# Patient Record
Sex: Female | Born: 1950 | Race: White | Hispanic: No | Marital: Married
Health system: Midwestern US, Academic
[De-identification: ages and names within clinical notes are randomized; demographics above are authoritative.]

---

## 2018-09-28 ENCOUNTER — Encounter: Admit: 2018-09-28 | Discharge: 2018-09-28 | Payer: MEDICARE

## 2018-10-04 ENCOUNTER — Encounter: Admit: 2018-10-04 | Discharge: 2018-10-04 | Payer: MEDICARE

## 2018-10-19 LAB — COMPREHENSIVE METABOLIC PANEL
Lab: 0.9
Lab: 114
Lab: 142
Lab: 4.6
Lab: 49
Lab: 9.8

## 2018-10-19 LAB — THYROID STIMULATING HORMONE-TSH: Lab: 5.2 — ABNORMAL HIGH (ref 0.35–4.94)

## 2018-10-19 LAB — FREE T4 (FREE THYROXINE) ONLY: Lab: 0.7

## 2018-10-19 LAB — HEMOGLOBIN A1C: Lab: 5.6

## 2018-10-28 ENCOUNTER — Encounter: Admit: 2018-10-28 | Discharge: 2018-10-28 | Payer: MEDICARE

## 2018-10-28 DIAGNOSIS — R0602 Shortness of breath: Principal | ICD-10-CM

## 2018-10-28 DIAGNOSIS — R5382 Chronic fatigue, unspecified: ICD-10-CM

## 2018-10-28 LAB — COMPREHENSIVE METABOLIC PANEL
Lab: 0.8
Lab: 104
Lab: 14
Lab: 141
Lab: 2 — ABNORMAL HIGH (ref 0.0–1.0)
Lab: 25
Lab: 4.2
Lab: 56 — ABNORMAL HIGH (ref 5–34)
Lab: 7.6
Lab: 9.9
Lab: 98
Lab: 99

## 2018-10-28 LAB — CBC
Lab: 15
Lab: 218
Lab: 30
Lab: 33
Lab: 46
Lab: 5.1
Lab: 8
Lab: 91

## 2018-10-28 LAB — TROPONIN-I: Lab: 0

## 2018-11-16 ENCOUNTER — Encounter: Admit: 2018-11-16 | Discharge: 2018-11-16 | Payer: MEDICARE

## 2018-11-22 ENCOUNTER — Encounter: Admit: 2018-11-22 | Discharge: 2018-11-22 | Payer: MEDICARE

## 2018-11-22 DIAGNOSIS — E039 Hypothyroidism, unspecified: Secondary | ICD-10-CM

## 2018-11-22 DIAGNOSIS — G4733 Obstructive sleep apnea (adult) (pediatric): Secondary | ICD-10-CM

## 2018-11-22 DIAGNOSIS — R0600 Dyspnea, unspecified: Secondary | ICD-10-CM

## 2018-11-22 DIAGNOSIS — R079 Chest pain, unspecified: Secondary | ICD-10-CM

## 2018-11-25 ENCOUNTER — Encounter: Admit: 2018-11-25 | Discharge: 2018-11-25 | Payer: MEDICARE

## 2018-11-25 ENCOUNTER — Ambulatory Visit: Admit: 2018-11-25 | Discharge: 2018-11-25 | Payer: MEDICARE

## 2018-11-25 DIAGNOSIS — R0600 Dyspnea, unspecified: Secondary | ICD-10-CM

## 2018-11-25 DIAGNOSIS — R079 Chest pain, unspecified: Secondary | ICD-10-CM

## 2018-11-25 DIAGNOSIS — I2510 Atherosclerotic heart disease of native coronary artery without angina pectoris: ICD-10-CM

## 2018-11-25 DIAGNOSIS — G4733 Obstructive sleep apnea (adult) (pediatric): Secondary | ICD-10-CM

## 2018-11-25 DIAGNOSIS — E039 Hypothyroidism, unspecified: Secondary | ICD-10-CM

## 2018-11-25 LAB — BASIC METABOLIC PANEL
Lab: 141
Lab: 4.2

## 2018-11-25 LAB — CBC
Lab: 13
Lab: 29 — ABNORMAL HIGH (ref 8.4–10.2)
Lab: 47 — ABNORMAL HIGH (ref 37.0–47.0)
Lab: 15
Lab: 218
Lab: 31 — ABNORMAL LOW (ref 33.0–37.0)
Lab: 5
Lab: 7.4
Lab: 94

## 2018-11-25 MED ORDER — TEMAZEPAM 7.5 MG PO CAP
15 mg | Freq: Every evening | ORAL | 0 refills | Status: CN | PRN
Start: 2018-11-25 — End: ?

## 2018-11-25 MED ORDER — ACETAMINOPHEN 325 MG PO TAB
650 mg | ORAL | 0 refills | Status: CN | PRN
Start: 2018-11-25 — End: ?

## 2018-11-25 MED ORDER — ALUMINUM-MAGNESIUM HYDROXIDE 200-200 MG/5 ML PO SUSP
30 mL | ORAL | 0 refills | Status: CN | PRN
Start: 2018-11-25 — End: ?

## 2018-11-25 MED ORDER — DOCUSATE SODIUM 100 MG PO CAP
100 mg | Freq: Every day | ORAL | 0 refills | Status: CN | PRN
Start: 2018-11-25 — End: ?

## 2018-11-25 MED ORDER — NITROGLYCERIN 0.3 MG SL SUBL
.4 mg | SUBLINGUAL | 0 refills | Status: CN | PRN
Start: 2018-11-25 — End: ?

## 2018-11-30 ENCOUNTER — Encounter: Admit: 2018-11-30 | Discharge: 2018-11-30 | Payer: MEDICARE

## 2018-12-01 ENCOUNTER — Ambulatory Visit: Admit: 2018-12-01 | Discharge: 2018-12-01 | Payer: MEDICARE

## 2018-12-01 ENCOUNTER — Encounter: Admit: 2018-12-01 | Discharge: 2018-12-01 | Payer: MEDICARE

## 2018-12-01 DIAGNOSIS — I5189 Other ill-defined heart diseases: Secondary | ICD-10-CM

## 2018-12-01 DIAGNOSIS — R9439 Abnormal result of other cardiovascular function study: Secondary | ICD-10-CM

## 2018-12-01 DIAGNOSIS — G4733 Obstructive sleep apnea (adult) (pediatric): Secondary | ICD-10-CM

## 2018-12-01 DIAGNOSIS — R0600 Dyspnea, unspecified: Secondary | ICD-10-CM

## 2018-12-01 DIAGNOSIS — R0789 Other chest pain: Secondary | ICD-10-CM

## 2018-12-01 DIAGNOSIS — R079 Chest pain, unspecified: Secondary | ICD-10-CM

## 2018-12-01 DIAGNOSIS — E039 Hypothyroidism, unspecified: Secondary | ICD-10-CM

## 2018-12-01 LAB — LIPID PROFILE
Lab: 122 mg/dL (ref <150)
Lab: 147 mg/dL (ref <200)
Lab: 24 mg/dL
Lab: 55 mg/dL (ref >40)
Lab: 86 mg/dL (ref <100)
Lab: 92 mg/dL

## 2018-12-01 LAB — POC GLUCOSE: Lab: 108 mg/dL — ABNORMAL HIGH (ref 70–100)

## 2018-12-01 MED ORDER — KETOROLAC 15 MG/ML IJ SOLN
15 mg | Freq: Once | INTRAVENOUS | 0 refills | Status: DC
Start: 2018-12-01 — End: 2018-12-02

## 2018-12-01 MED ORDER — DOCUSATE SODIUM 100 MG PO CAP
100 mg | Freq: Every day | ORAL | 0 refills | Status: DC | PRN
Start: 2018-12-01 — End: 2018-12-02

## 2018-12-01 MED ORDER — DIPHENHYDRAMINE HCL 50 MG/ML IJ SOLN
25 mg | INTRAVENOUS | 0 refills | Status: DC | PRN
Start: 2018-12-01 — End: 2018-12-02

## 2018-12-01 MED ORDER — ACETAMINOPHEN 325 MG PO TAB
650 mg | ORAL | 0 refills | Status: DC | PRN
Start: 2018-12-01 — End: 2018-12-02
  Administered 2018-12-01: 21:00:00 650 mg via ORAL

## 2018-12-01 MED ORDER — TEMAZEPAM 15 MG PO CAP
15 mg | Freq: Every evening | ORAL | 0 refills | Status: DC | PRN
Start: 2018-12-01 — End: 2018-12-02

## 2018-12-01 MED ORDER — NITROGLYCERIN 0.4 MG SL SUBL
.4 mg | SUBLINGUAL | 0 refills | Status: DC | PRN
Start: 2018-12-01 — End: 2018-12-02

## 2018-12-01 MED ORDER — ALUMINUM-MAGNESIUM HYDROXIDE 200-200 MG/5 ML PO SUSP
30 mL | ORAL | 0 refills | Status: DC | PRN
Start: 2018-12-01 — End: 2018-12-02

## 2018-12-01 MED ORDER — ASPIRIN 325 MG PO TAB
325 mg | Freq: Once | ORAL | 0 refills | Status: CP
Start: 2018-12-01 — End: ?
  Administered 2018-12-01: 16:00:00 325 mg via ORAL

## 2018-12-01 MED ORDER — SODIUM CHLORIDE 0.9 % IV SOLP
1000 mL | Freq: Once | INTRAVENOUS | 0 refills | Status: CP
Start: 2018-12-01 — End: ?
  Administered 2018-12-01: 16:00:00 1000 mL via INTRAVENOUS

## 2018-12-01 MED ORDER — METFORMIN 500 MG PO TAB
500 mg | ORAL_TABLET | Freq: Every day | ORAL | 0 refills | Status: AC
Start: 2018-12-01 — End: ?

## 2018-12-01 MED ORDER — ASPIRIN 81 MG PO TBEC
81 mg | ORAL_TABLET | Freq: Every day | ORAL | 0 refills | Status: AC
Start: 2018-12-01 — End: 2020-03-13

## 2018-12-01 MED ORDER — DIPHENHYDRAMINE HCL 25 MG PO CAP
25 mg | ORAL | 0 refills | Status: DC | PRN
Start: 2018-12-01 — End: 2018-12-02

## 2018-12-01 MED ORDER — ONDANSETRON HCL (PF) 4 MG/2 ML IJ SOLN
4 mg | INTRAVENOUS | 0 refills | Status: DC | PRN
Start: 2018-12-01 — End: 2018-12-02

## 2018-12-01 MED ADMIN — KETOROLAC 30 MG/ML (1 ML) IJ SOLN [22473]: 15 mg | INTRAVENOUS | @ 22:00:00 | Stop: 2018-12-01 | NDC 47781058493

## 2019-01-06 ENCOUNTER — Encounter: Admit: 2019-01-06 | Discharge: 2019-01-06 | Payer: MEDICARE

## 2019-01-06 ENCOUNTER — Ambulatory Visit: Admit: 2019-01-06 | Discharge: 2019-01-07 | Payer: MEDICARE

## 2019-01-06 DIAGNOSIS — I2510 Atherosclerotic heart disease of native coronary artery without angina pectoris: Principal | ICD-10-CM

## 2019-01-06 DIAGNOSIS — R0600 Dyspnea, unspecified: ICD-10-CM

## 2019-01-06 DIAGNOSIS — G4733 Obstructive sleep apnea (adult) (pediatric): ICD-10-CM

## 2019-01-06 DIAGNOSIS — R079 Chest pain, unspecified: Principal | ICD-10-CM

## 2019-01-06 DIAGNOSIS — E782 Mixed hyperlipidemia: ICD-10-CM

## 2019-01-06 DIAGNOSIS — I5189 Other ill-defined heart diseases: ICD-10-CM

## 2019-01-06 DIAGNOSIS — E039 Hypothyroidism, unspecified: ICD-10-CM

## 2019-06-23 ENCOUNTER — Encounter: Admit: 2019-06-23 | Discharge: 2019-06-23

## 2019-06-23 ENCOUNTER — Ambulatory Visit: Admit: 2019-06-23 | Discharge: 2019-06-24

## 2019-06-23 DIAGNOSIS — I10 Essential (primary) hypertension: Secondary | ICD-10-CM

## 2019-06-23 DIAGNOSIS — G4733 Obstructive sleep apnea (adult) (pediatric): Secondary | ICD-10-CM

## 2019-06-23 DIAGNOSIS — R079 Chest pain, unspecified: Secondary | ICD-10-CM

## 2019-06-23 DIAGNOSIS — R0600 Dyspnea, unspecified: Secondary | ICD-10-CM

## 2019-06-23 DIAGNOSIS — E039 Hypothyroidism, unspecified: Secondary | ICD-10-CM

## 2019-06-23 DIAGNOSIS — E038 Other specified hypothyroidism: Secondary | ICD-10-CM

## 2019-06-23 LAB — SED RATE: Lab: 22

## 2019-06-23 LAB — BNP (B-TYPE NATRIURETIC PEPTI): Lab: 15

## 2019-06-23 LAB — THYROID STIMULATING HORMONE-TSH: Lab: 1.3

## 2019-06-23 NOTE — Progress Notes
Date of Service: 06/23/2019    Nichole Wright is a 68 y.o. female.       HPI     I saw Nichole Wright today in followup of her hyperlipidemia, chest pain.  The chest pain has essentially resolved.  Her primary complaint now is that she cannot seem to lose weight.  She tells me she is very active throughout the day, does not seem to have increased her food intake, but yet is not losing weight.       She does need to lose about 25 to 30 pounds at a minimum.  She is wearing a boot over her right lower ankle due to a recent ligament strain, so we cannot really do much extra walking until that is completely healed.       Otherwise, I think she needs to have some sort of increased daily activity for 20 minutes daily without fail in addition to her current activities. I would do it as an add-on, either in the evenings or in the morning as a 20 minute walk once her leg is healed.    ([id number])             Vitals:    06/23/19 1346   BP: 112/62   BP Source: Arm, Left Upper   Pulse: 68   Weight: 99.8 kg (220 lb)   Height: 1.626 m (5' 4)   PainSc: Zero     Body mass index is 37.76 kg/m???.     Past Medical History  Patient Active Problem List    Diagnosis Date Noted   ??? Diastolic dysfunction 12/01/2018   ??? Hyperlipidemia 12/01/2018   ??? Coronary artery disease due to lipid rich plaque 12/01/2018   ??? Chest pain 11/22/2018   ??? Dyspnea on exertion 11/22/2018     10/29/2018 PFT's: Essentially normal baseline spirometry.  There was a trend towards improvement post bronchodilator.  Normal lung volumes.  Total lung capacity 86% of predicted.  The ERV, however, was only 90% which goes along with an obesity pattern.  Normal diffusion at 82%.  Flow volume loops and time volume curves look relatively normal.   10/29/2018 CT Chest:  No acute abnormalities were identified in the CT scan of the chest.  06/09/2018  Echo:  Est EF 60%.  Mild concentric left ventricular hypertrophy.  Diastolic assessment reveals Grade I diastolic dysfunction. Normal right ventricle structure and function.  Trivial to small global pericardial effusion.     ??? Hypothyroidism 11/22/2018   ??? OSA (obstructive sleep apnea) 11/22/2018         Review of Systems   Constitution: Positive for malaise/fatigue.   HENT: Negative.    Eyes: Negative.    Cardiovascular: Negative.    Respiratory: Negative.    Endocrine: Negative.    Hematologic/Lymphatic: Negative.    Skin: Negative.    Musculoskeletal: Negative.    Gastrointestinal: Negative.    Genitourinary: Negative.    Neurological: Negative.    Psychiatric/Behavioral: Negative.    Allergic/Immunologic: Negative.        Physical Exam  Examination reveals a 68 year old lady who is obese but in no apparent distress.  She is wearing a boot/ligament brace over her right ankle.  She has no pitting edema, cyanosis, or clubbing.  Affect is normal.  No wheezing.    ([id number])      Cardiovascular Studies  We are going to check a BNP, a sed rate, and a TSH.    ([id number])  Problems Addressed Today  No diagnosis found.    Assessment and Plan     In summary, this is a 68 year old lady who, despite being very active in her yard throughout the summer months, has been unable to lose weight.  She has not had her TSH checked in the last 6 months.  She is on a low dose of Synthroid.  I am just going to make sure that she is not hypothyroid with a blood sample.  We will also do a BNP.  She states that she gets short of breath with exercise, but has no obstructive disease, so I want to be sure that we do not have some other evidence that might support diastolic dysfunction.  For now, we will wait for the test results and then have her back.  I would like to see her back in 6 weeks.  I am giving her a target of a 6-pound weight loss over that time interval.    ([id number])             Current Medications (including today's revisions)  ??? aspirin EC 81 mg tablet Take one tablet by mouth daily. Take with food. ??? cefdinir (OMNICEF) 300 mg capsule Take 300 mg by mouth every 12 hours.   ??? clonazePAM (KLONOPIN) 1 mg tablet Take 1 mg by mouth daily.   ??? folic acid/multivit-min/lutein (CENTRUM SILVER PO) Take 1 tablet by mouth daily.   ??? levocetirizine 5 mg tab Take 1 tablet by mouth at bedtime daily.   ??? levothyroxine (SYNTHROID) 75 mcg tablet Take 75 mcg by mouth daily.   ??? metFORMIN (GLUCOPHAGE) 500 mg tablet Take one tablet by mouth daily. resume on 1/24 with evening dose   ??? montelukast (SINGULAIR) 10 mg tablet Take 10 mg by mouth at bedtime daily.   ??? phentermine (ADIPEX-P) 37.5 mg tablet Take 1 tablet by mouth daily.   ??? pregabalin (LYRICA) 150 mg capsule Take 150 mg by mouth twice daily.   ??? rosuvastatin (CRESTOR) 5 mg tablet Take 5 mg by mouth daily.   ??? traMADoL (ULTRAM) 50 mg tablet Q8H

## 2019-06-24 ENCOUNTER — Encounter: Admit: 2019-06-24 | Discharge: 2019-06-24

## 2019-06-24 DIAGNOSIS — I10 Essential (primary) hypertension: Secondary | ICD-10-CM

## 2019-06-24 DIAGNOSIS — R51 Headache: Secondary | ICD-10-CM

## 2019-06-24 DIAGNOSIS — R55 Syncope and collapse: Secondary | ICD-10-CM

## 2019-06-24 DIAGNOSIS — N200 Calculus of kidney: Secondary | ICD-10-CM

## 2019-06-24 DIAGNOSIS — E038 Other specified hypothyroidism: Secondary | ICD-10-CM

## 2019-06-24 DIAGNOSIS — G4730 Sleep apnea, unspecified: Secondary | ICD-10-CM

## 2019-06-24 DIAGNOSIS — R0683 Snoring: Secondary | ICD-10-CM

## 2019-06-24 DIAGNOSIS — G4733 Obstructive sleep apnea (adult) (pediatric): Secondary | ICD-10-CM

## 2019-06-24 DIAGNOSIS — R0600 Dyspnea, unspecified: Secondary | ICD-10-CM

## 2019-06-24 DIAGNOSIS — M1990 Unspecified osteoarthritis, unspecified site: Secondary | ICD-10-CM

## 2019-06-24 DIAGNOSIS — E039 Hypothyroidism, unspecified: Secondary | ICD-10-CM

## 2019-06-24 DIAGNOSIS — I119 Hypertensive heart disease without heart failure: Secondary | ICD-10-CM

## 2019-06-24 DIAGNOSIS — F329 Major depressive disorder, single episode, unspecified: Secondary | ICD-10-CM

## 2019-06-24 DIAGNOSIS — R079 Chest pain, unspecified: Secondary | ICD-10-CM

## 2019-06-24 DIAGNOSIS — K660 Peritoneal adhesions (postprocedural) (postinfection): Secondary | ICD-10-CM

## 2019-06-24 DIAGNOSIS — J302 Other seasonal allergic rhinitis: Secondary | ICD-10-CM

## 2019-06-24 DIAGNOSIS — N159 Renal tubulo-interstitial disease, unspecified: Secondary | ICD-10-CM

## 2019-08-23 ENCOUNTER — Encounter: Admit: 2019-08-23 | Discharge: 2019-08-23 | Payer: MEDICARE

## 2019-08-23 DIAGNOSIS — E782 Mixed hyperlipidemia: Secondary | ICD-10-CM

## 2019-08-23 DIAGNOSIS — M1990 Unspecified osteoarthritis, unspecified site: Secondary | ICD-10-CM

## 2019-08-23 DIAGNOSIS — J302 Other seasonal allergic rhinitis: Secondary | ICD-10-CM

## 2019-08-23 DIAGNOSIS — E039 Hypothyroidism, unspecified: Secondary | ICD-10-CM

## 2019-08-23 DIAGNOSIS — F329 Major depressive disorder, single episode, unspecified: Secondary | ICD-10-CM

## 2019-08-23 DIAGNOSIS — G4733 Obstructive sleep apnea (adult) (pediatric): Secondary | ICD-10-CM

## 2019-08-23 DIAGNOSIS — I119 Hypertensive heart disease without heart failure: Secondary | ICD-10-CM

## 2019-08-23 DIAGNOSIS — R0600 Dyspnea, unspecified: Secondary | ICD-10-CM

## 2019-08-23 DIAGNOSIS — K660 Peritoneal adhesions (postprocedural) (postinfection): Secondary | ICD-10-CM

## 2019-08-23 DIAGNOSIS — N159 Renal tubulo-interstitial disease, unspecified: Secondary | ICD-10-CM

## 2019-08-23 DIAGNOSIS — G4730 Sleep apnea, unspecified: Secondary | ICD-10-CM

## 2019-08-23 DIAGNOSIS — R519 Chronic headache: Secondary | ICD-10-CM

## 2019-08-23 DIAGNOSIS — R079 Chest pain, unspecified: Secondary | ICD-10-CM

## 2019-08-23 DIAGNOSIS — R55 Syncope and collapse: Secondary | ICD-10-CM

## 2019-08-23 DIAGNOSIS — N200 Calculus of kidney: Secondary | ICD-10-CM

## 2019-08-23 DIAGNOSIS — R0683 Snoring: Secondary | ICD-10-CM

## 2019-09-13 ENCOUNTER — Encounter: Admit: 2019-09-13 | Discharge: 2019-09-13 | Payer: MEDICARE

## 2019-09-13 NOTE — Progress Notes
Received a request from Dr. Maple Hudson office asking for cardiac clearance for R ankle tendon repair scheduled 09/29/2019.  Will review with TLR and then respond to Dr. Maple Hudson request.        Last OV 08/2019  Last Thallium 10/2018: SUMMARY/OPINION:This study is normal with no evidence of significant myocardial ischemia. Left ventricular systolic function is normal. There are no high risk prognostic indicators present.  The pharmacologic ECG portion of the study is negative for ischemia.

## 2019-09-20 ENCOUNTER — Encounter: Admit: 2019-09-20 | Discharge: 2019-09-20 | Payer: MEDICARE

## 2019-09-20 NOTE — Progress Notes
Patient is here for ekg needs cardiac clearance.  She denies chest pain, shortness of breath or any other cardiology issues.

## 2020-03-13 ENCOUNTER — Encounter: Admit: 2020-03-13 | Discharge: 2020-03-13 | Payer: MEDICARE

## 2020-03-13 LAB — D-DIMER: Lab: 248

## 2020-04-10 ENCOUNTER — Encounter: Admit: 2020-04-10 | Discharge: 2020-04-10 | Payer: MEDICARE

## 2020-04-10 DIAGNOSIS — R0602 Shortness of breath: Secondary | ICD-10-CM

## 2020-04-10 DIAGNOSIS — I119 Hypertensive heart disease without heart failure: Secondary | ICD-10-CM

## 2020-04-10 DIAGNOSIS — N159 Renal tubulo-interstitial disease, unspecified: Secondary | ICD-10-CM

## 2020-04-10 DIAGNOSIS — I5189 Other ill-defined heart diseases: Secondary | ICD-10-CM

## 2020-04-10 DIAGNOSIS — G4730 Sleep apnea, unspecified: Secondary | ICD-10-CM

## 2020-04-10 DIAGNOSIS — R0600 Dyspnea, unspecified: Secondary | ICD-10-CM

## 2020-04-10 DIAGNOSIS — R55 Syncope and collapse: Secondary | ICD-10-CM

## 2020-04-10 DIAGNOSIS — R079 Chest pain, unspecified: Secondary | ICD-10-CM

## 2020-04-10 DIAGNOSIS — R519 Chronic headache: Secondary | ICD-10-CM

## 2020-04-10 DIAGNOSIS — R0683 Snoring: Secondary | ICD-10-CM

## 2020-04-10 DIAGNOSIS — J302 Other seasonal allergic rhinitis: Secondary | ICD-10-CM

## 2020-04-10 DIAGNOSIS — N200 Calculus of kidney: Secondary | ICD-10-CM

## 2020-04-10 DIAGNOSIS — F329 Major depressive disorder, single episode, unspecified: Secondary | ICD-10-CM

## 2020-04-10 DIAGNOSIS — G4733 Obstructive sleep apnea (adult) (pediatric): Secondary | ICD-10-CM

## 2020-04-10 DIAGNOSIS — M1990 Unspecified osteoarthritis, unspecified site: Secondary | ICD-10-CM

## 2020-04-10 DIAGNOSIS — I10 Essential (primary) hypertension: Secondary | ICD-10-CM

## 2020-04-10 DIAGNOSIS — E039 Hypothyroidism, unspecified: Secondary | ICD-10-CM

## 2020-04-10 DIAGNOSIS — K660 Peritoneal adhesions (postprocedural) (postinfection): Secondary | ICD-10-CM

## 2020-04-13 ENCOUNTER — Encounter: Admit: 2020-04-13 | Discharge: 2020-04-13 | Payer: MEDICARE

## 2020-04-13 ENCOUNTER — Ambulatory Visit: Admit: 2020-04-13 | Discharge: 2020-04-13 | Payer: MEDICARE

## 2020-04-13 DIAGNOSIS — R0600 Dyspnea, unspecified: Secondary | ICD-10-CM

## 2020-04-27 ENCOUNTER — Encounter: Admit: 2020-04-27 | Discharge: 2020-04-27 | Payer: MEDICARE

## 2020-04-27 DIAGNOSIS — R0602 Shortness of breath: Secondary | ICD-10-CM

## 2020-04-27 DIAGNOSIS — I10 Essential (primary) hypertension: Secondary | ICD-10-CM

## 2020-04-27 DIAGNOSIS — R0600 Dyspnea, unspecified: Secondary | ICD-10-CM

## 2020-05-01 ENCOUNTER — Encounter: Admit: 2020-05-01 | Discharge: 2020-05-01 | Payer: MEDICARE

## 2020-05-31 ENCOUNTER — Encounter: Admit: 2020-05-31 | Discharge: 2020-05-31 | Payer: MEDICARE

## 2020-05-31 DIAGNOSIS — K660 Peritoneal adhesions (postprocedural) (postinfection): Secondary | ICD-10-CM

## 2020-05-31 DIAGNOSIS — G4730 Sleep apnea, unspecified: Secondary | ICD-10-CM

## 2020-05-31 DIAGNOSIS — F329 Major depressive disorder, single episode, unspecified: Secondary | ICD-10-CM

## 2020-05-31 DIAGNOSIS — N200 Calculus of kidney: Secondary | ICD-10-CM

## 2020-05-31 DIAGNOSIS — E039 Hypothyroidism, unspecified: Secondary | ICD-10-CM

## 2020-05-31 DIAGNOSIS — R079 Chest pain, unspecified: Secondary | ICD-10-CM

## 2020-05-31 DIAGNOSIS — M1990 Unspecified osteoarthritis, unspecified site: Secondary | ICD-10-CM

## 2020-05-31 DIAGNOSIS — R0600 Dyspnea, unspecified: Secondary | ICD-10-CM

## 2020-05-31 DIAGNOSIS — M79605 Pain in left leg: Secondary | ICD-10-CM

## 2020-05-31 DIAGNOSIS — I10 Essential (primary) hypertension: Secondary | ICD-10-CM

## 2020-05-31 DIAGNOSIS — I119 Hypertensive heart disease without heart failure: Secondary | ICD-10-CM

## 2020-05-31 DIAGNOSIS — R0989 Other specified symptoms and signs involving the circulatory and respiratory systems: Secondary | ICD-10-CM

## 2020-05-31 DIAGNOSIS — R0683 Snoring: Secondary | ICD-10-CM

## 2020-05-31 DIAGNOSIS — I825Z2 Chronic embolism and thrombosis of unspecified deep veins of left distal lower extremity: Secondary | ICD-10-CM

## 2020-05-31 DIAGNOSIS — J302 Other seasonal allergic rhinitis: Secondary | ICD-10-CM

## 2020-05-31 DIAGNOSIS — R55 Syncope and collapse: Secondary | ICD-10-CM

## 2020-05-31 DIAGNOSIS — G4733 Obstructive sleep apnea (adult) (pediatric): Secondary | ICD-10-CM

## 2020-05-31 DIAGNOSIS — R0602 Shortness of breath: Secondary | ICD-10-CM

## 2020-05-31 DIAGNOSIS — N159 Renal tubulo-interstitial disease, unspecified: Secondary | ICD-10-CM

## 2020-05-31 DIAGNOSIS — R519 Chronic headache: Secondary | ICD-10-CM

## 2020-05-31 NOTE — Telephone Encounter
Dr. Geronimo Boot reviewed [place] report in clinic today. Called patient to discuss results. Patient has no questions at this time.

## 2020-05-31 NOTE — Patient Instructions
Ultrasound left leg    Exercise EKG

## 2020-06-13 ENCOUNTER — Encounter: Admit: 2020-06-13 | Discharge: 2020-06-13 | Payer: MEDICARE

## 2020-06-13 ENCOUNTER — Ambulatory Visit: Admit: 2020-06-13 | Discharge: 2020-06-13 | Payer: MEDICARE

## 2020-06-13 DIAGNOSIS — R0600 Dyspnea, unspecified: Secondary | ICD-10-CM

## 2020-06-13 DIAGNOSIS — R0602 Shortness of breath: Secondary | ICD-10-CM

## 2020-06-13 DIAGNOSIS — I10 Essential (primary) hypertension: Secondary | ICD-10-CM

## 2020-06-13 MED ORDER — EUCALYPTUS-MENTHOL MM LOZG
1 | Freq: Once | ORAL | 0 refills | Status: AC | PRN
Start: 2020-06-13 — End: ?

## 2020-06-13 MED ORDER — SODIUM CHLORIDE 0.9 % IV SOLP
250 mL | INTRAVENOUS | 0 refills | Status: DC | PRN
Start: 2020-06-13 — End: 2020-06-18

## 2020-06-13 MED ORDER — NITROGLYCERIN 0.4 MG SL SUBL
.4 mg | SUBLINGUAL | 0 refills | Status: DC | PRN
Start: 2020-06-13 — End: 2020-06-18

## 2020-06-13 NOTE — Progress Notes
Patient tolerated treadmill only stress test well. Patient was disconnected from EKG monitor and changed into her clothing when she began having sharp chest pain that radiated to left arm. Patient connected back up for EKG and bp. Physician, Dr. Sandria Manly notified. Patient given one dose of nitro (BP 132/80) with no relief and 5 minutes later given a second dose (BP 132/80) with no change in pain. Dr. Sandria Manly examined patient, reviewed test/EKG and past records. Dr. Sandria Manly feels since her pain is reproduceable and worse with inspiration and palpation it is likely musculoskeletal pain. Patient allowed to rest and then home with husband. Discussed with patient when seek medical care.     Patient's blood sugar checked during this episode also and it was 97.

## 2020-06-28 ENCOUNTER — Encounter: Admit: 2020-06-28 | Discharge: 2020-06-28 | Payer: MEDICARE

## 2020-06-28 NOTE — Telephone Encounter
-----   Message from Renetta Chalk, RN sent at 06/27/2020  5:23 PM CDT -----    ----- Message -----  From: Laurence Aly, MD  Sent: 06/27/2020   3:48 PM CDT  To: Renetta Chalk, RN    Let pt know this is normal.

## 2020-06-28 NOTE — Telephone Encounter
Left voicemail notifying patient her treadmill only test was normal. Left call back number for further questions and concerns.

## 2021-02-01 ENCOUNTER — Ambulatory Visit: Admit: 2021-02-01 | Discharge: 2021-02-01 | Payer: MEDICARE

## 2021-02-01 ENCOUNTER — Encounter: Admit: 2021-02-01 | Discharge: 2021-02-01 | Payer: MEDICARE

## 2021-02-01 DIAGNOSIS — E039 Hypothyroidism, unspecified: Secondary | ICD-10-CM

## 2021-02-01 DIAGNOSIS — E119 Type 2 diabetes mellitus without complications: Secondary | ICD-10-CM

## 2021-02-01 DIAGNOSIS — E785 Hyperlipidemia, unspecified: Secondary | ICD-10-CM

## 2021-02-01 DIAGNOSIS — Z789 Other specified health status: Secondary | ICD-10-CM

## 2021-02-01 DIAGNOSIS — R0600 Dyspnea, unspecified: Secondary | ICD-10-CM

## 2021-02-01 DIAGNOSIS — J189 Pneumonia, unspecified organism: Secondary | ICD-10-CM

## 2022-09-08 ENCOUNTER — Encounter: Admit: 2022-09-08 | Discharge: 2022-09-08 | Payer: MEDICARE

## 2023-09-16 IMAGING — CR SHOULDCMRT
4 series · 4 of 4 positions shown · non-contrast
Comparison: none

[w shoulder external right]
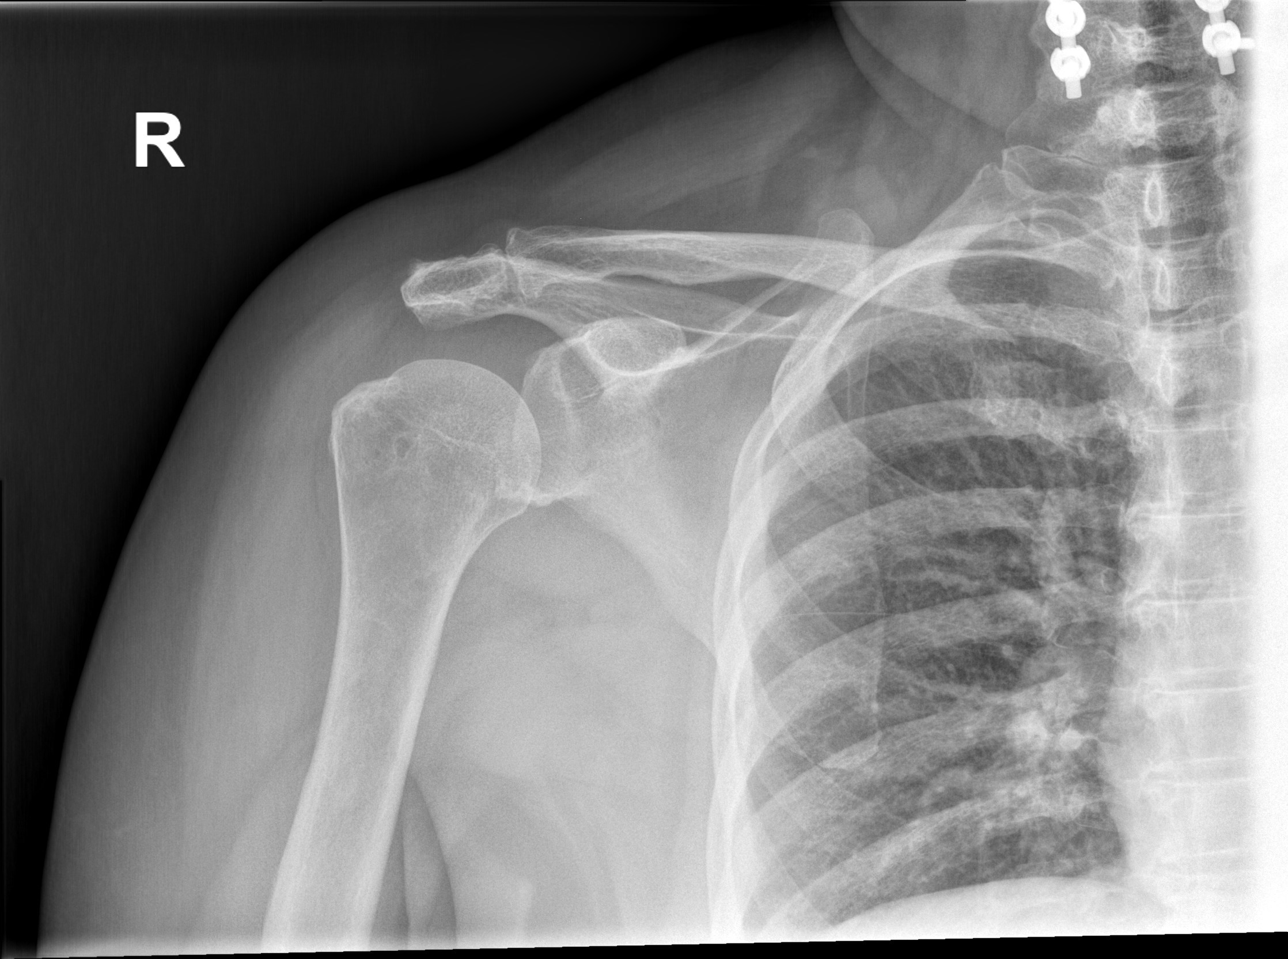

[w shoulder internal right]
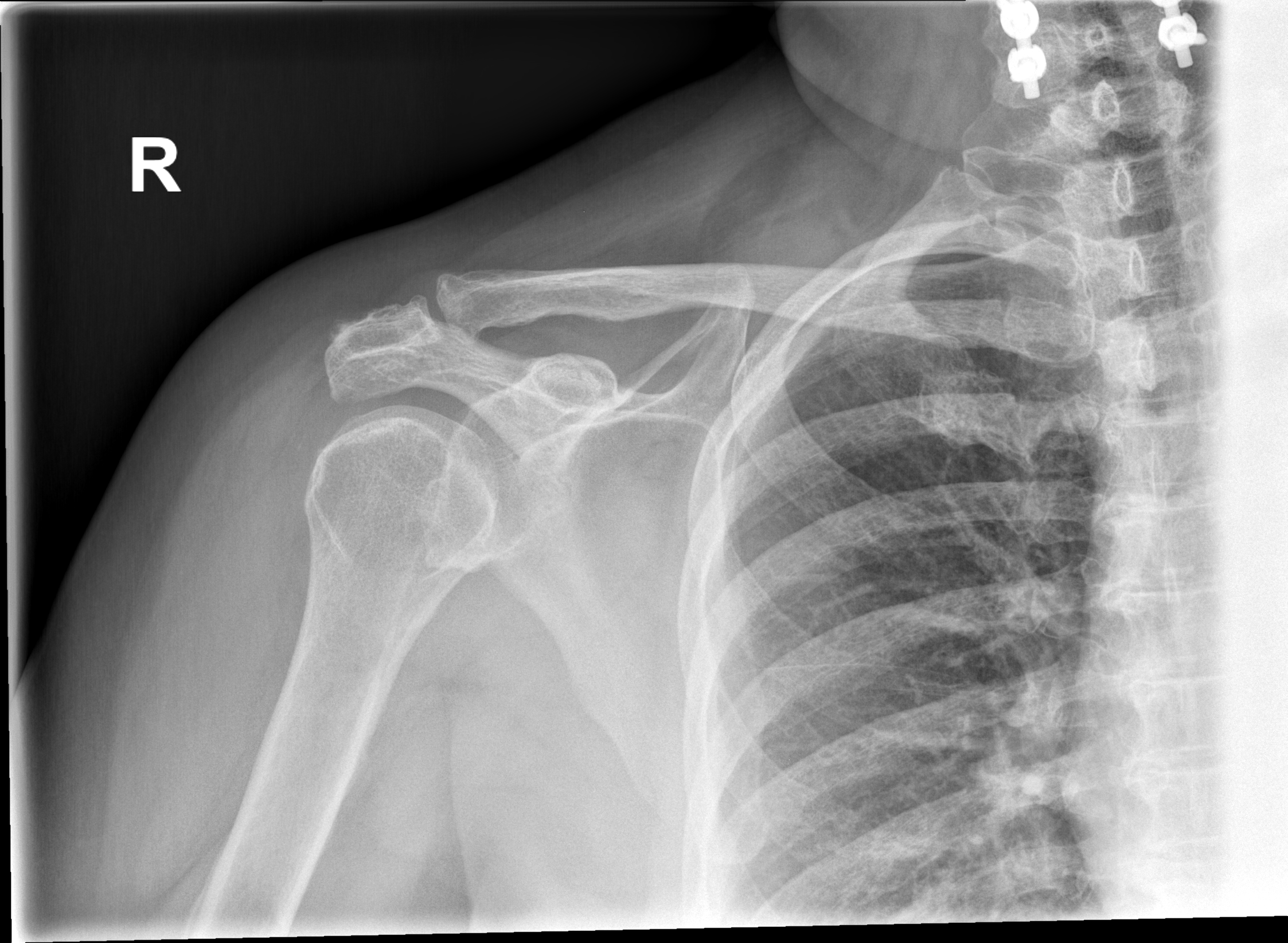

[w shoulder y-view ap right]
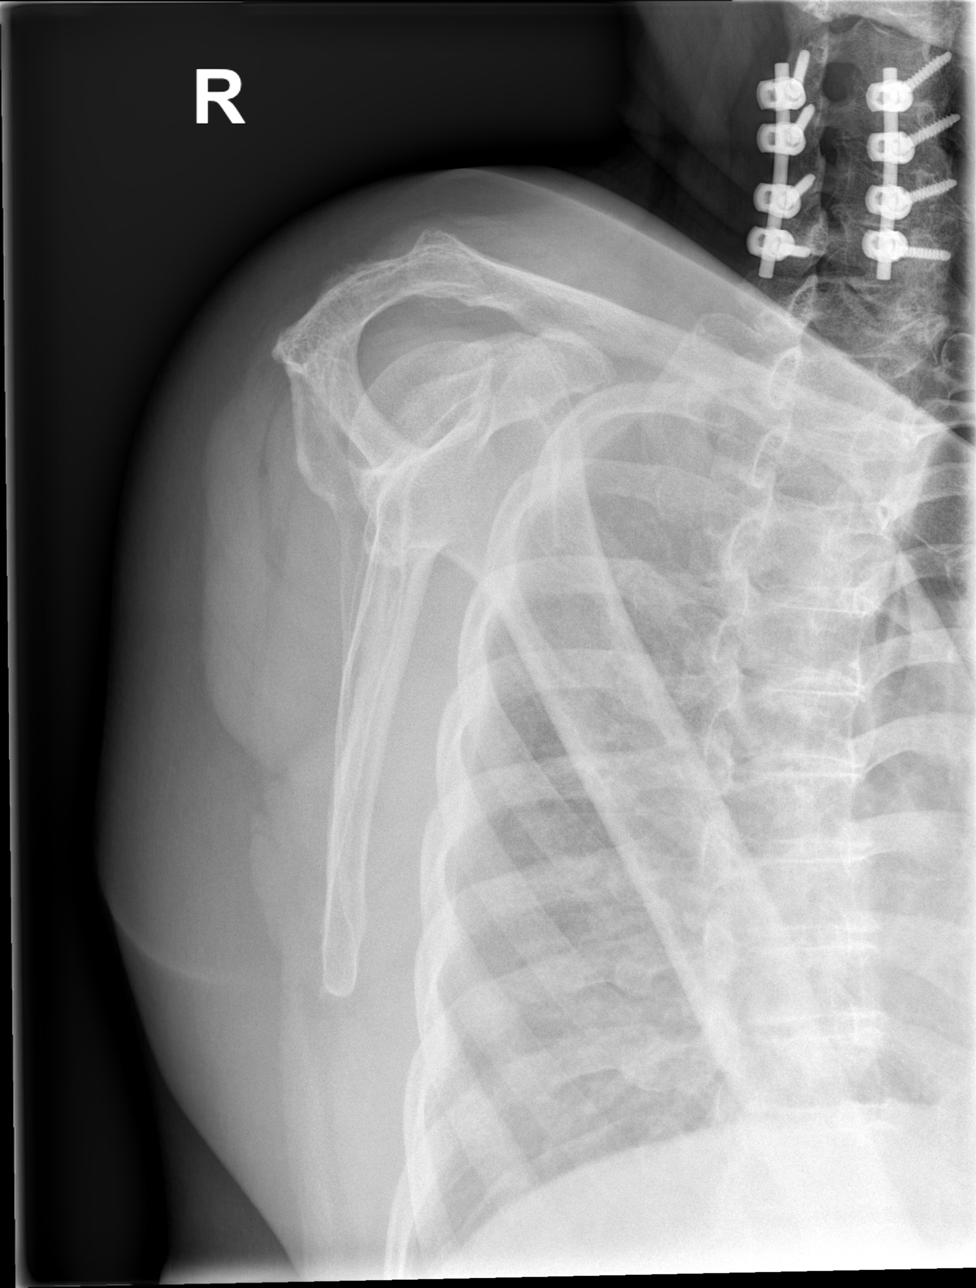

[x shoulder axial right]
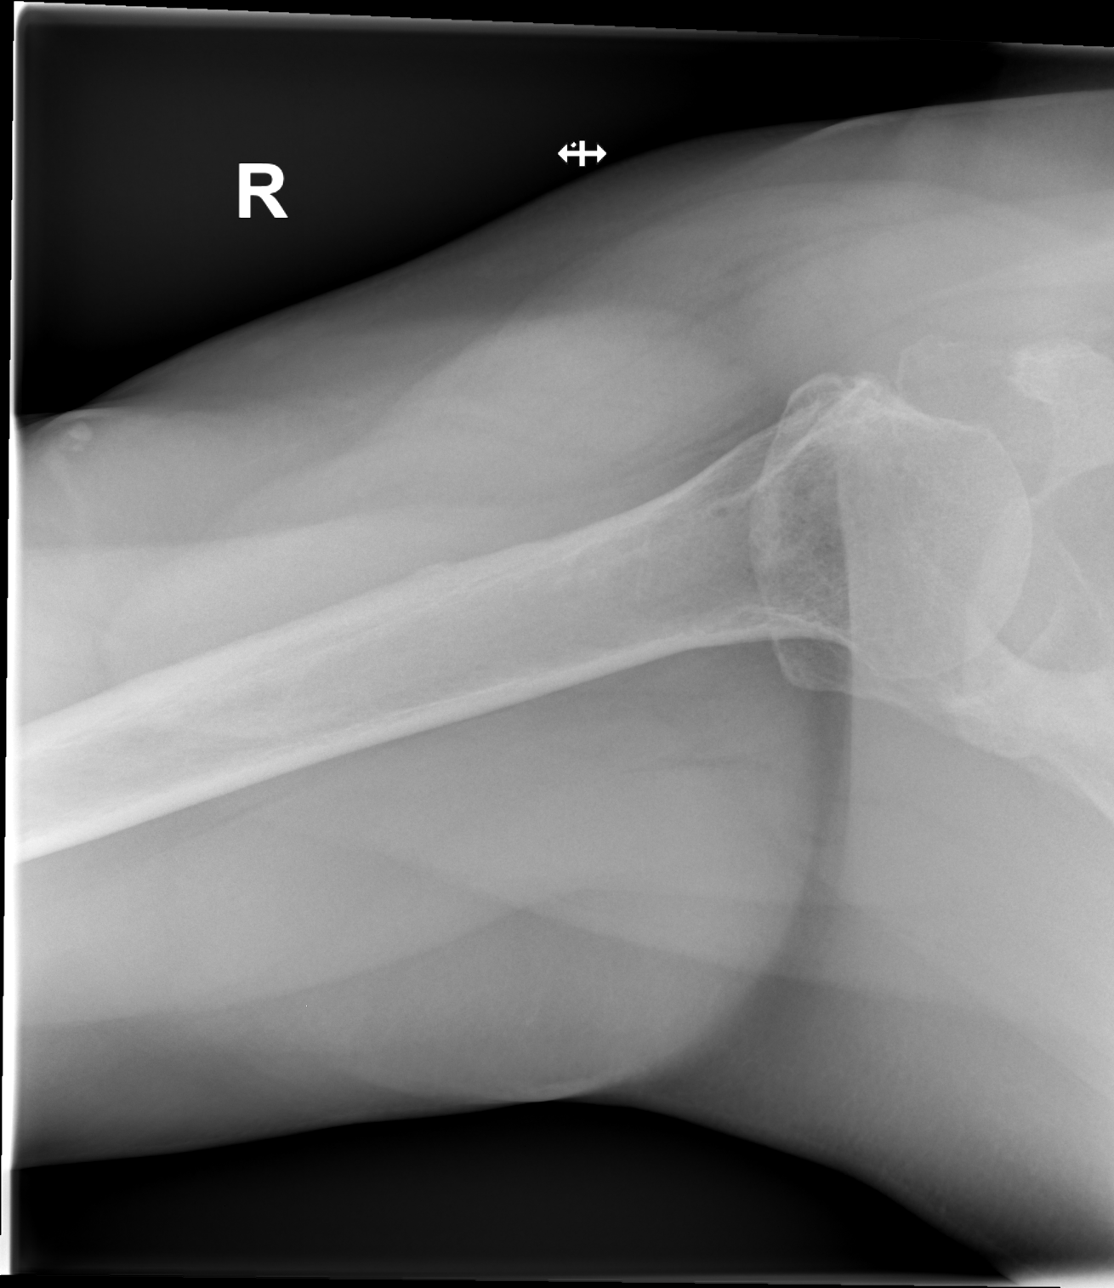

[4 of 4 positions shown; findings below may reference images not displayed]

DIAGNOSTIC STUDIES

EXAM

XR shoulder right, complete

INDICATION

Right arm injury x 1 day
Fall down the stairs x 2 days ago. Pt c/o right shoulder pain and limited ROM. CF

TECHNIQUE

Internal external rotated views lateral views and axillary views

COMPARISONS

None available

FINDINGS

Degenerative changes of the AC joint are seen. No fractures are evident.

IMPRESSION

Degenerative changes of the AC joint. Postop changes cervical spine are partially imaged.

Tech Notes:

Fall down the stairs x 2 days ago. Pt c/o right shoulder pain and limited ROM. CF

## 2023-09-16 IMAGING — CR ELBOWCMRT
4 series · 4 of 4 positions shown · non-contrast
Comparison: none

[x elbow lat right]
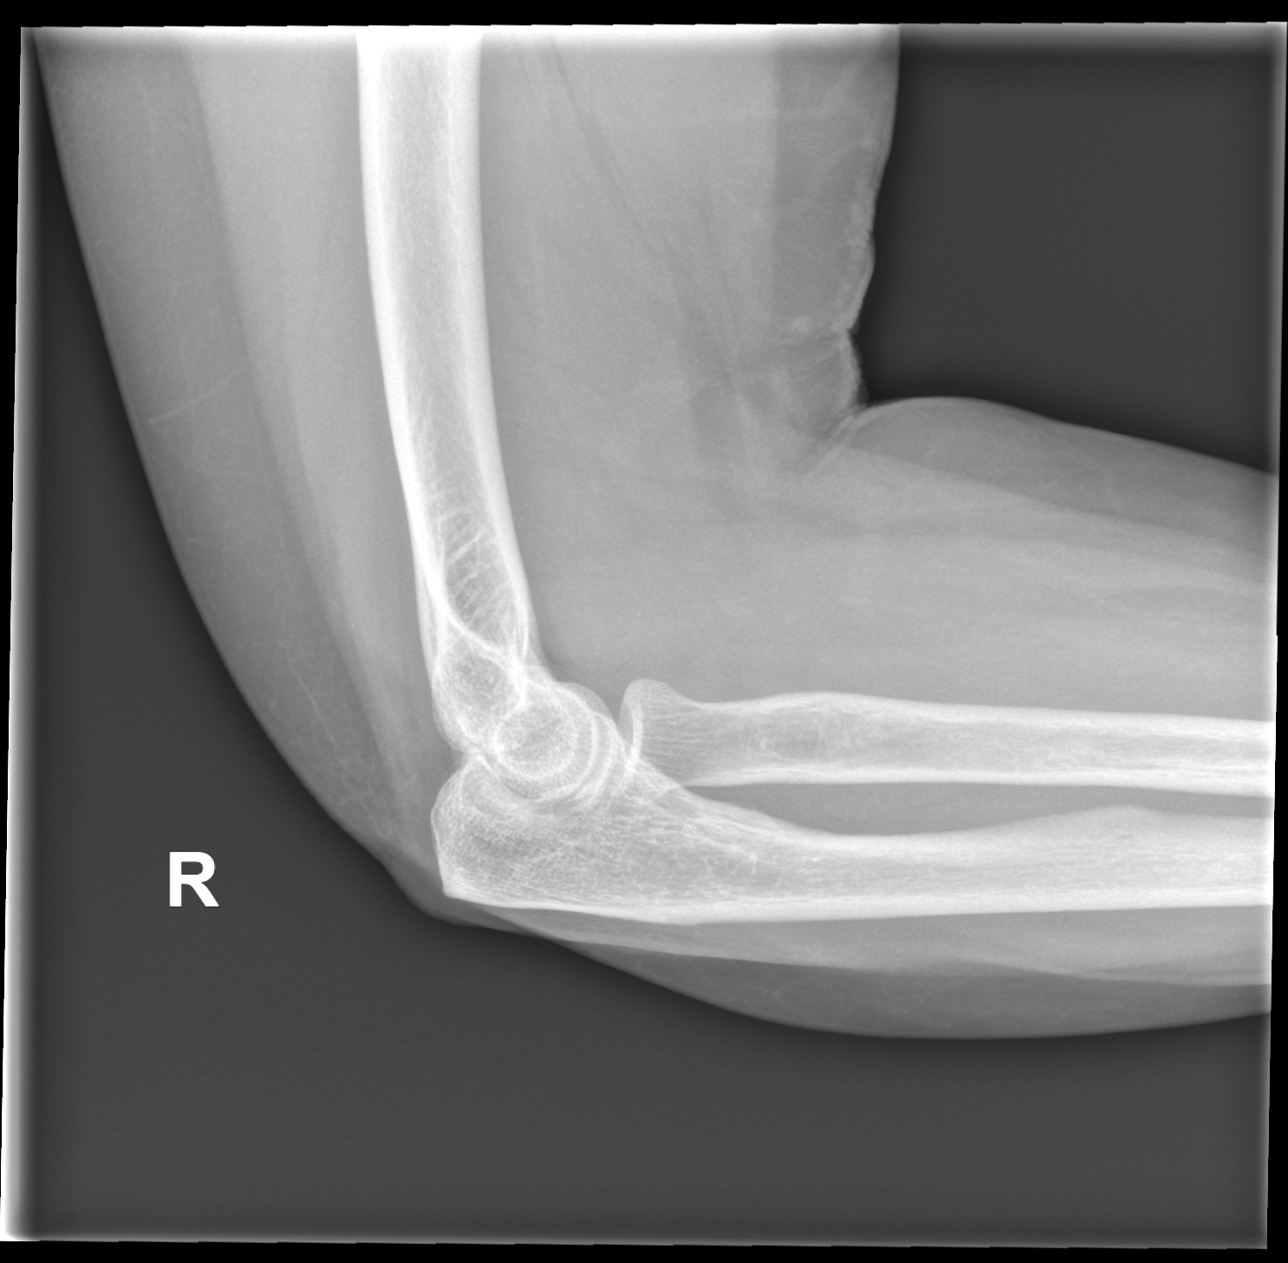

[x elbow ap right]
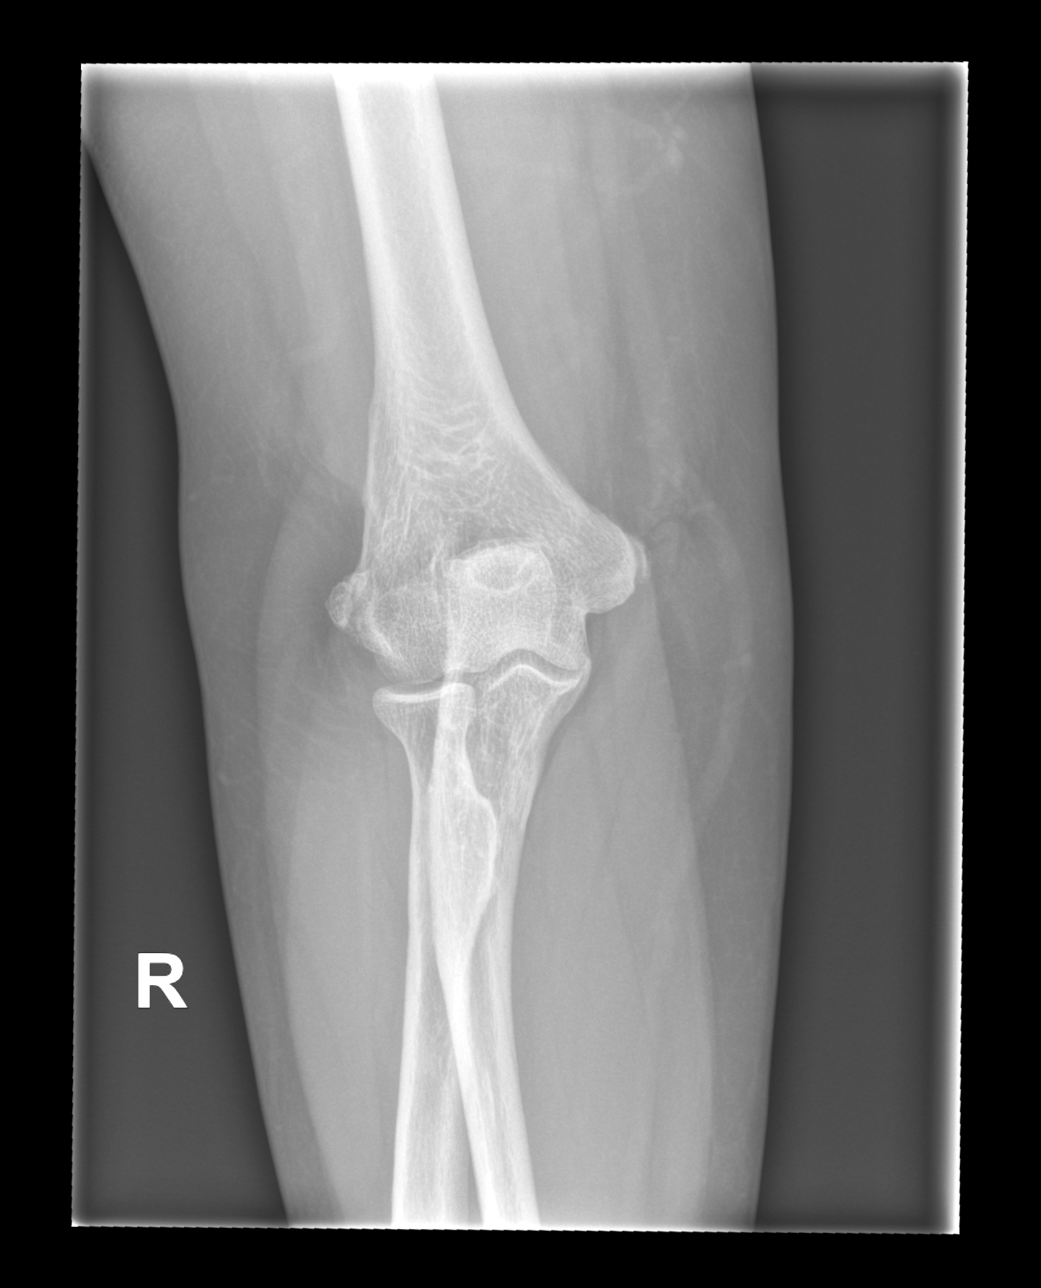

[x elbow obl right (1 of 2)]
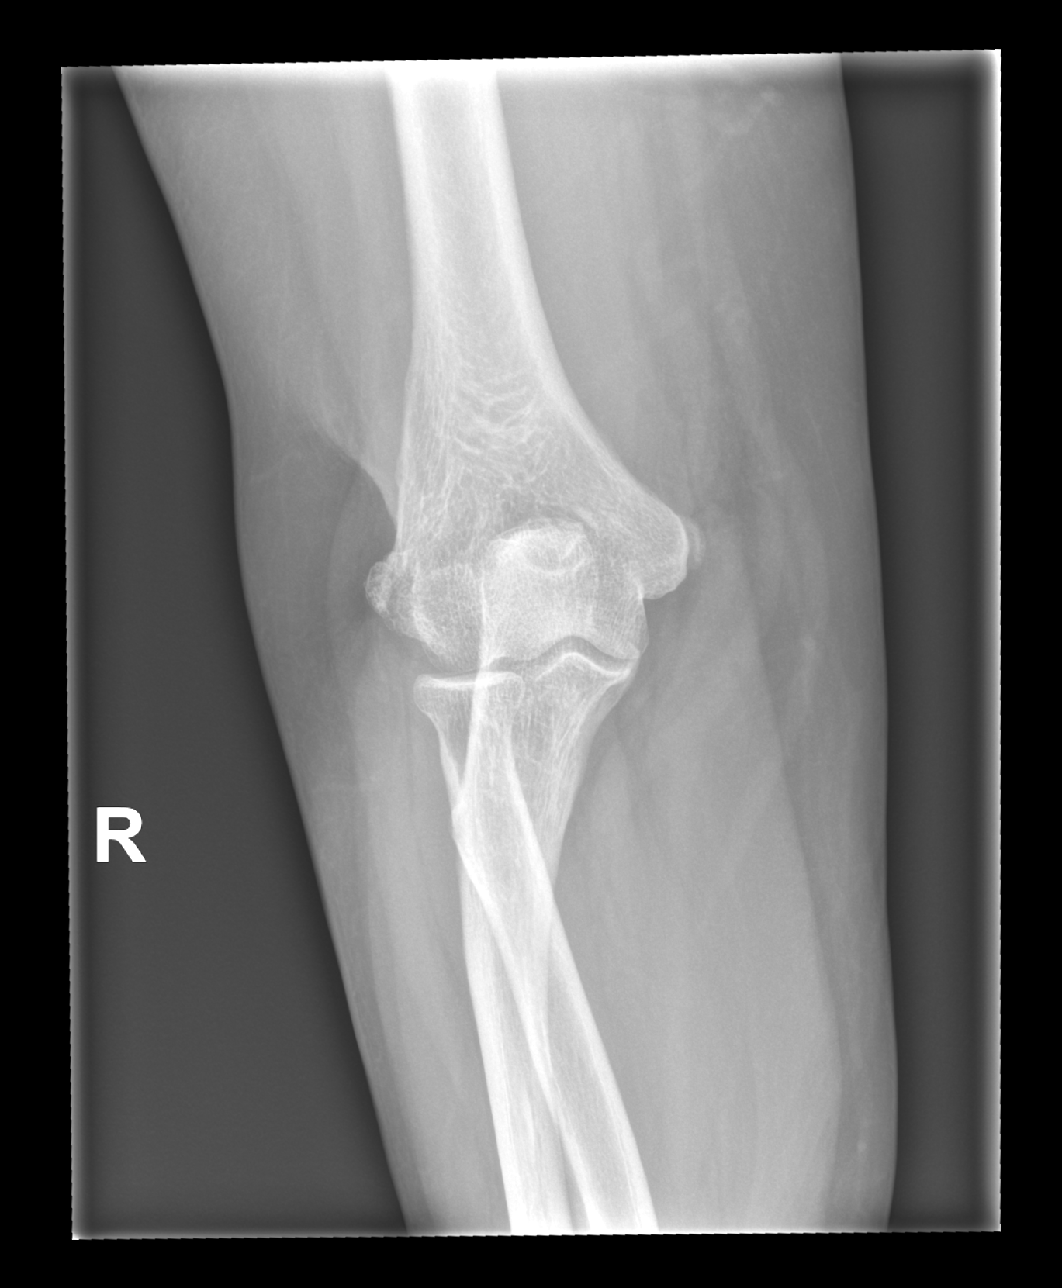

[x elbow obl right (2 of 2)]
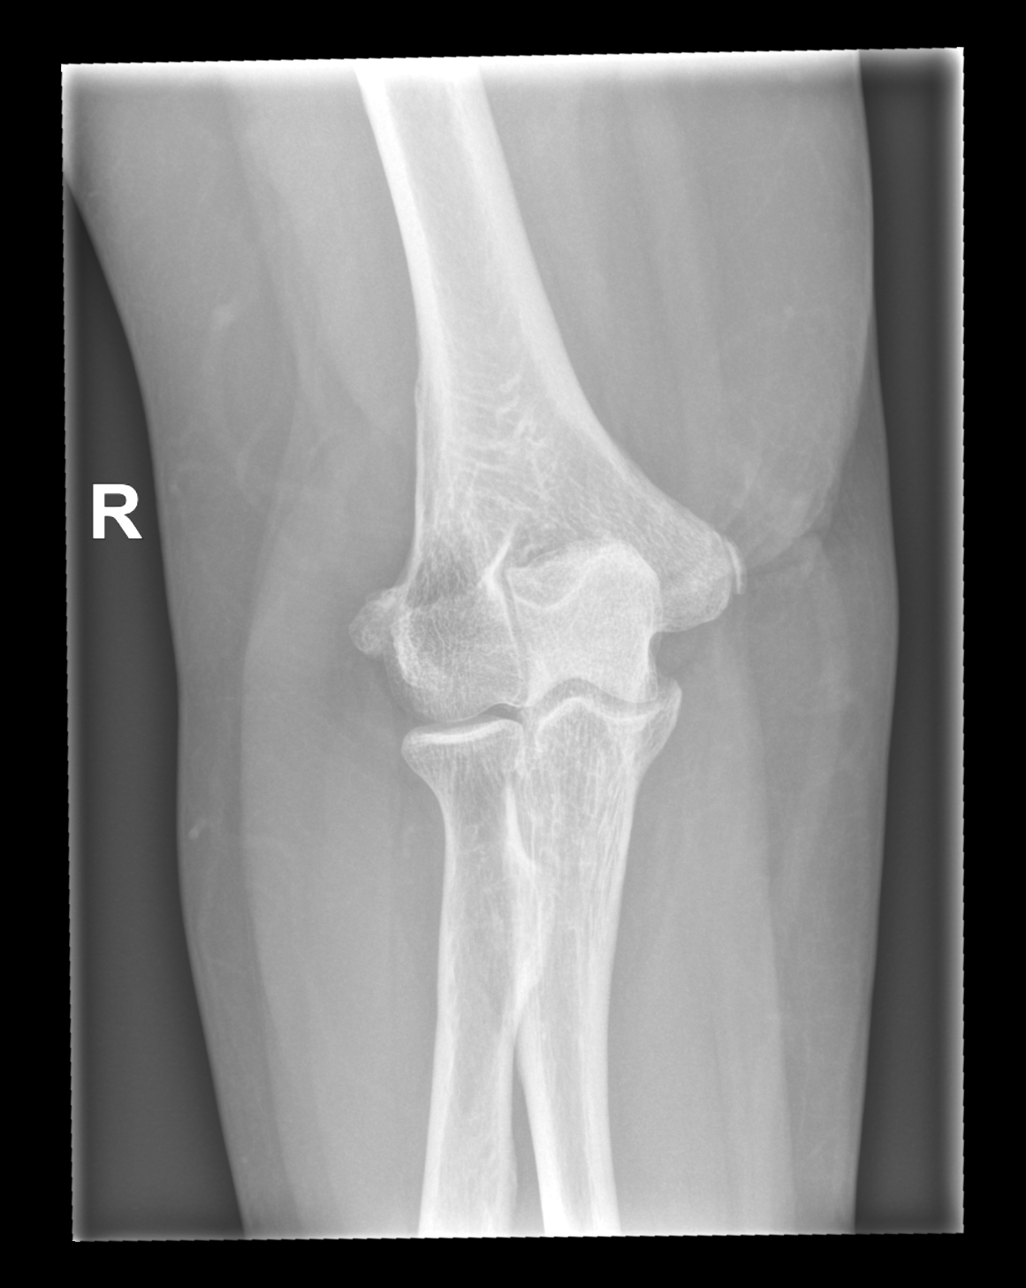

[4 of 4 positions shown; findings below may reference images not displayed]

DIAGNOSTIC STUDIES

EXAM

XR elbow RT min 3V

INDICATION

Right arm injury x 1 day
Fall down the stairs x 2 days ago. Pt c/o right elbow pain and limited ROM. CF

TECHNIQUE

AP lateral both obliques

COMPARISONS

None available

FINDINGS

Small calcific densities are seen adjacent to the epicondyles bilaterally which probably are
dystrophic. Over the medial humeral condyle there is a lucency and correlation to exclude point
tenderness or possible avulsion fracture is recommended

IMPRESSION

Small calcifications adjacent to the humeral condyles which probably are dystrophic. Correlation
with physical exam is recommended. Please see above discussion.

Tech Notes:

Fall down the stairs x 2 days ago. Pt c/o right elbow pain and limited ROM. CF

## 2024-04-20 ENCOUNTER — Encounter: Admit: 2024-04-20 | Discharge: 2024-04-20 | Payer: MEDICARE

## 2024-04-25 ENCOUNTER — Encounter: Admit: 2024-04-25 | Discharge: 2024-04-25 | Payer: MEDICARE

## 2024-04-29 ENCOUNTER — Encounter: Admit: 2024-04-29 | Discharge: 2024-04-29 | Payer: MEDICARE

## 2024-05-02 ENCOUNTER — Encounter: Admit: 2024-05-02 | Discharge: 2024-05-02 | Payer: MEDICARE

## 2024-05-02 ENCOUNTER — Ambulatory Visit: Admit: 2024-05-02 | Discharge: 2024-05-02 | Payer: MEDICARE

## 2024-05-02 DIAGNOSIS — M47816 Spondylosis without myelopathy or radiculopathy, lumbar region: Secondary | ICD-10-CM

## 2024-05-02 MED ORDER — MELOXICAM 7.5 MG PO TAB
7.5 mg | ORAL_TABLET | Freq: Every day | ORAL | 0 refills | 30.00000 days | Status: AC
Start: 2024-05-02 — End: ?

## 2024-05-02 MED ORDER — DULOXETINE 30 MG PO CPDR
30 mg | ORAL_CAPSULE | Freq: Every day | ORAL | 0 refills | 60.00000 days | Status: AC
Start: 2024-05-02 — End: ?
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
## 2024-05-03 ENCOUNTER — Encounter: Admit: 2024-05-03 | Discharge: 2024-05-03 | Payer: MEDICARE

## 2024-05-03 DIAGNOSIS — Z981 Arthrodesis status: Secondary | ICD-10-CM

## 2024-05-03 DIAGNOSIS — M4722 Other spondylosis with radiculopathy, cervical region: Secondary | ICD-10-CM

## 2024-05-09 ENCOUNTER — Encounter: Admit: 2024-05-09 | Discharge: 2024-05-09 | Payer: MEDICARE

## 2024-05-09 NOTE — Telephone Encounter
 05/09/24 Records requested per [facility] note below CRM      Please request additional records form PCP, Dr. Dorn Bruns - Ph: [phone]; Fax: [phone].

## 2024-05-11 ENCOUNTER — Encounter: Admit: 2024-05-11 | Discharge: 2024-05-11 | Payer: MEDICARE

## 2024-06-03 ENCOUNTER — Encounter: Admit: 2024-06-03 | Discharge: 2024-06-03 | Payer: MEDICARE

## 2024-06-11 ENCOUNTER — Encounter: Admit: 2024-06-11 | Discharge: 2024-06-11 | Payer: MEDICARE

## 2024-06-13 ENCOUNTER — Encounter: Admit: 2024-06-13 | Discharge: 2024-06-13 | Payer: MEDICARE

## 2024-06-13 DIAGNOSIS — R079 Chest pain, unspecified: Principal | ICD-10-CM

## 2024-06-13 NOTE — Progress Notes
 CARDIAC NEW PATIENT PROFILE      PHYSICIANS INFORMATION:   REFERRING PHYSICIAN: Dr. Ethel Bruns, MD   PCP: Dr. Ethel Bruns, MD   OTHER PROVIDERS: Dr. Ozell Don, MD - pain clinic    REASON FOR VISIT/DIAGNOSIS: Referral from Dr. Bruns to establish care for CP, Hypertension, Hyperlipidemia,     RECENT EVENTS/SYMPTOMS:    04/06/24 - From Dr. Ethel Lor OV notes: Nichole Wright is here today to follow up from being in the ED.  She states that she is still feeling pain in her chest, SOA, shakey, weak and fatigued.  She doesn't feel like she is getting better.  When she gets the pain in her chest sometimes its like a shocking surge.  Patient is in today for continued plethora of symptoms.  She has a history of migraines and is having a lot of headaches.  She is having a lot of neck stiffness as well.  Had cervical spinal surgery in 2022 for neck and feels very stiff, sore, tight in her neck and she was in the ER 6 days ago for symptoms of this as well as chest pain.  Cardiac workup negative.  We did coronary calcium score on her in January 2025 and showed a score of 10 which was low risk.  She does have depression and anxiety, but reports these are doing well at this time.  Has DM2, but last A1c 5.4 q year ago, glucose 190 in ER; has hypothyroidism and last TSH was 4 in September 2024; she's had autoimmune workup in [id number] and that was also all normal.  Has sedentary lifestyle and doesn't have exercise regimen.   03/31/24 [facility] ED Notes: 73 year old female presents to the emergency room for midsternal chest pain that radiates into her back.  Significant past medical history of anxiety, depression, headache, diabetes, obesity, OSA, HLD, hypothyroidism, migraines and thoracic back pain.  Patient reports that she has been working outside in the garden as well as deep cleaning her house but does not feel as though she has just pulled a muscle.  Patient denies any fever, URI symptoms, emesis, constipation, diarrhea or dysuria.     PERTINENT CARDIAC HISTORY:    Chest Pain, Hypertension, Hyperlipidemia    OTHER MEDICAL HISTORY:    Arthritis, Adhesion of intestine, Depression, Anxiety, DOE, Hypothyroidism, OSA, Diabetes    MOST RECENT PERTINENT TESTING/PROCEDURES:    04/19/2024 - Regadenoson MPI at [facility] - Global left ventricular function is normal.  LVEF is 87%.  MPI shows no evidence of myocardial infarction or ischemia.  Negative stress test.     04/19/2024 - Echocardiogram at [facility] - Technically difficult study with poor acoustic windows.  Hyperdynamic left ventricular function, EF>75%; valves were not well seen but there is not significant disease by doppler criteria.  No obvious pericardial effusion.     11/12/23 - CT Calcium Score at [facility] - Total Calcium Score 10.7; Left anterior descending artery 10.7  02/01/21 - Echocardiogram at [facility] - No regional wall motion abnormalities are seen. Overall LV systolic function appears normal. The estimated left ventricular ejection fraction is 60%.  Left ventricular contractility appears similar when compared with the prior echocardiogram performed on 04/13/20. Normal left ventricular diastolic function. Right ventricular contractility appears normal. Normal atrial chamber dimensions. The aortic, mitral and tricuspid cardiac valves appear structurally normal. There is no evidence of significant valvular regurgitation or stenosis by doppler exam.A small pericardial effusion is noted adjacent to the basal inferior and  inferior lateral left ventricular segments.  There is no evidence for chamber compression or tamponade.  The effusion appears to be fairly similar in size and location to that seen on the prior echocardiogram obtained on 04/13/2020.  06/13/20 - Treadmill Only Stress Test at [facility] - Non Ischemic Maximal Exercise Stress EKG Atypical Chest Pain Non Cardiac After Completion With No ST Depression [facility] Treadmill Score=6 Low Risk  04/13/20 - Echocardiogram at [facility] - LVEF=75% With No Regional Wall Motion Abnormality Normal LV Wall Thickness Normal Chamber Dimensions Normal Right Ventricular Chamber Size And Systolic Function Normal TAPSE=2.1cm Trace Posterior Pericardial Fluid Normal Ascending Aorta Poor Visualization  12/01/18 - Cardiac Cath at [facility] - Minimal, nonobstructive coronary artery disease. Elevated LVEDP  10/28/18 - Regadenoson MPI at [facility] - This study is normal with no evidence of significant myocardial ischemia. Left ventricular systolic function is normal. There are no high risk prognostic indicators present. The pharmacologic ECG portion of the study is negative for ischemia.     PERTINENT SURGERIES:    No cardiac surgery.    SOCIAL HISTORY:    Patient has never smoked.  She does not drink alcohol.  She does not use recreational drugs.      PERTINENT CARDIAC FAMILY HISTORY:    Mother: Heart Attack, Pacemaker   Father: COPD, Heart Attack, Asthma, Stroke   Brother: Heart Attack    MEDICATIONS/ALLERGIES: reviewed and reconciled.    LAB: 10/29/23 - LDL 73, pt is on Rosuvastatin  5 mg daily.  LIPID PROFILE (10/29/2023)     RECORDS: internal, external and care everywhere.    IMAGES: Outside EKG (04/06/2024) , requested echo, stress test and CT Calcium Score from [place] via [facility].

## 2024-06-14 ENCOUNTER — Encounter: Admit: 2024-06-14 | Discharge: 2024-06-14 | Payer: MEDICARE

## 2024-06-14 ENCOUNTER — Ambulatory Visit: Admit: 2024-06-14 | Discharge: 2024-06-14 | Payer: MEDICARE

## 2024-06-14 DIAGNOSIS — R002 Palpitations: Secondary | ICD-10-CM

## 2024-06-14 DIAGNOSIS — E782 Mixed hyperlipidemia: Secondary | ICD-10-CM

## 2024-06-14 DIAGNOSIS — Z136 Encounter for screening for cardiovascular disorders: Principal | ICD-10-CM

## 2024-06-14 MED ORDER — LOSARTAN 50 MG PO TAB
50 mg | ORAL_TABLET | Freq: Every day | ORAL | 3 refills | 90.00000 days | Status: AC
Start: 2024-06-14 — End: ?

## 2024-06-14 MED ORDER — ROSUVASTATIN 20 MG PO TAB
20 mg | ORAL_TABLET | Freq: Every day | ORAL | 3 refills | 90.00000 days | Status: AC
Start: 2024-06-14 — End: ?

## 2024-06-14 NOTE — Progress Notes
 Ambulatory (External) Cardiac Monitor Enrollment Record     Placement Location: Home Enrollment  Clinic Location: MPB5  Vendor: [employer] ([employer])  Mobile Cardiac Telemetry (MCOT/MCT)?: No  Duration of Monitor (in days): 14  Monitor Diagnosis: Palpitations (R00.2)  No data recordedOrdering Provider: Elon Serene  AMB Monitor Serial Number: home  No data recorded    Start Time and Date: 06/14/24 1:22 PM   Patient Name: Nichole Wright  DOB: [birth year]  MRN: [id number]  Sex: female  Mobile Phone Number: [phone] (mobile)  Home Phone Number: [phone]  Patient Address: [address] [place] [address]  Insurance Coverage: [facility] MEDICARE REPLACEMENT - [id number]  Insurance ID: [id number]  Insurance Group #: [id number]  Insurance Subscriber: Nichole Wright  Implanted Cardiac Device Information: No results found for: [id number]      Patient instructed to contact company phone number on the monitor box with questions regarding billing, placement, troubleshooting.     Nichole Wright    ____________________________________________________________    Clinic Staff:    Complete additional steps for documentation double check/Co-Sign.  In Follow-up, send chart upon closing encounter to [facility] HR[facility] AMBULATORY MONITORS    HR[facility] Ambulatory Monitoring Team:  Schedule on appropriate template and check-in.   Clinic Placement Schedule on clinic location [facility] schedule   Home Enrollment Schedule on Home Enrollment schedule ([facility])   Given to patient in clinic for self-placement Schedule on Home Enrollment schedule ([facility] RHYTHM)   Inpatient Schedule on [facility] AMBULATORY MONITORING template   2. Please enroll with appropriate vendor.

## 2024-06-14 NOTE — Patient Instructions
 Thank you for visiting our office today.    We would like to make the following medication adjustments:      Increase cozaar  to 50 mg once daily  Increase crestor  to 20 mg once daily       Otherwise continue the same medications as you have been doing.          We will be pursuing the following tests after your appointment today:       14 day heart monitor- this will be mailed to your home address- it will have all instructions and supplies needed. Please keep the packaging it arrives in, you will send it back in it.     Referral for sleep apnea    Have labs for cholesterol drawn at next appointment       We will plan to see you back in 6 months.  Please call us  in the meantime with any questions or concerns.        Please allow 5-7 business days for our providers to review your results. All normal results will go to MyChart. If you do not have Mychart, it is strongly recommended to get this so you can easily view all your results. If you do not have mychart, we will attempt to call you once with normal lab and testing results. If we cannot reach you by phone with normal results, we will send you a letter.  If you have not heard the results of your testing after one week please give us  a call.       Your Cardiovascular Medicine [place] Team [place], Olam Pierce and [place])  phone number is [phone].

## 2024-06-14 NOTE — Progress Notes
 Cardiovascular Medicine       Date of Service: 06/14/2024      HPI     Nichole Wright is a 73 y.o. female who was seen today in the Cardiovascular Medicine Clinic at [facility] at our [place] office.     She presents today to establish care.  She saw Dr. Charlette several years ago.    She has a past medical history of hypertension, hyperlipidemia, no significant obstructive coronary artery disease by angiogram in 2020, anxiety, depression, hypothyroidism, obstructive sleep apnea not on CPAP, diabetes.  She also has a history of migraines.    She had a visit to [facility] in May 2025 with symptoms of chest pain that radiated to her back.  In addition to this, she has multiple complaints including fatigue, episodes of significant sweating, exertional dyspnea and episodes of profuse sweating.  Cardiac biomarkers and ECG were unremarkable.  Stress test was ordered that did not show any significant inducible ischemia.  It appears these complaints are unchanged from several years ago when she saw Dr. Charlette.  At that time,her echocardiogram shows no wall motion abnormalities in a workup for dyspnea dated from 04/13/2020.  Her blood work has been but unremarkable.  Her urinalysis for her hypertension did not show any evidence for pheochromocytoma.  Her electrolytes have been normal.  Liver function tests normal.  She has a total bili 1.36, normal hemoglobin.  Cholesterol 148, LDL 48, HDL 39, TSH 2.93.  C-reactive protein 0.17.  D-dimer 248.     She continues to have the symptoms of fatigue, symptoms of sweating.  Today, she denies any further chest pain to me.  She has exertional dyspnea that has been chronic and longstanding and has noticed no significant changes recently.  She does report that she is finding it hard to maintain/upkeep of the house and chores as her husband is becoming increasingly disabled.  She does have obstructive sleep apnea but has not been able to use her CPAP as she is not comfortable with it.          MOST RECENT PERTINENT TESTING/PROCEDURES:                04/19/2024 - Regadenoson MPI at [facility] - Global left ventricular function is normal.  LVEF is 87%.  MPI shows no evidence of myocardial infarction or ischemia.  Negative stress test.                 04/19/2024 - Echocardiogram at [facility] - Technically difficult study with poor acoustic windows.  Hyperdynamic left ventricular function, EF>75%; valves were not well seen but there is not significant disease by doppler criteria.  No obvious pericardial effusion.                 11/12/23 - CT Calcium Score at [facility] - Total Calcium Score 10.7; Left anterior descending artery 10.7  02/01/21 - Echocardiogram at [facility] - No regional wall motion abnormalities are seen. Overall LV systolic function appears normal. The estimated left ventricular ejection fraction is 60%.  Left ventricular contractility appears similar when compared with the prior echocardiogram performed on 04/13/20. Normal left ventricular diastolic function. Right ventricular contractility appears normal. Normal atrial chamber dimensions. The aortic, mitral and tricuspid cardiac valves appear structurally normal. There is no evidence of significant valvular regurgitation or stenosis by doppler exam.A small pericardial effusion is noted adjacent to the basal inferior and inferior  lateral left ventricular segments.  There is no evidence for chamber compression or tamponade.  The effusion appears to be fairly similar in size and location to that seen on the prior echocardiogram obtained on 04/13/2020.  06/13/20 - Treadmill Only Stress Test at [facility] - Non Ischemic Maximal Exercise Stress EKG Atypical Chest Pain Non Cardiac After Completion With No ST Depression [facility] Treadmill Score=6 Low Risk  04/13/20 - Echocardiogram at [facility] - LVEF=75% With No Regional Wall Motion Abnormality Normal LV Wall Thickness Normal Chamber Dimensions Normal Right Ventricular Chamber Size And Systolic Function Normal TAPSE=2.1cm Trace Posterior Pericardial Fluid Normal Ascending Aorta Poor Visualization  12/01/18 - Cardiac Cath at [facility] - Minimal, nonobstructive coronary artery disease. Elevated LVEDP  10/28/18 - Regadenoson MPI at [facility] - This study is normal with no evidence of significant myocardial ischemia. Left ventricular systolic function is normal. There are no high risk prognostic indicators present. The pharmacologic ECG portion of the study is negative for ischemia.      PERTINENT SURGERIES:                No cardiac surgery.     SOCIAL HISTORY:                Patient has never smoked.  She does not drink alcohol.  She does not use recreational drugs.       PERTINENT CARDIAC FAMILY HISTORY:                Mother: Heart Attack, Pacemaker               Father: COPD, Heart Attack, Asthma, Stroke               Brother: Heart Attack          ECG: Normal sinus rhythm.          Assessment & Plan   73 y.o. female patient with the following medical problems:    Hypertension.  Hyperlipidemia.  Obstructive sleep apnea not on CPAP therapy.  Fatigue.  Palpitations.    Extensive ischemic evaluation including normal coronary angiogram in 2020 and recent stress MPI with no significant evidence of ischemia.  Also had low risk coronary artery calcium score.  Will require aggressive risk factor control.  Blood pressure is elevated in clinic and she reports increased blood pressure at home.  Will increase losartan  to 50 mg daily.  Suboptimal lipid control.  Will increase rosuvastatin  to 20 mg daily and repeat FLP in 6 months.  Goal LDL less than 70.  Continue efforts at improving exercise and weight loss.  Did discuss with her that her fatigue could be in part due to untreated sleep apnea.  Will plan to refer to sleep clinic.  Does have ongoing palpitations, untreated sleep apnea and fatigue.  Will place long-term cardiac monitor to assess for atrial fibrillation/arrhythmias.    Return to clinic in 6 months.         Past Medical History  Patient Active Problem List    Diagnosis Date Noted    Anxiety 06/13/2024    Diabetes mellitus (CMS-HCC) 06/13/2024    Migraines 06/13/2024    Diastolic dysfunction 12/01/2018    Hyperlipidemia 12/01/2018    Coronary artery disease due to lipid rich plaque 12/01/2018    Chest pain 11/22/2018    Dyspnea on exertion 11/22/2018     10/29/2018 PFT's: Essentially normal baseline spirometry.  There was a trend towards improvement post bronchodilator.  Normal lung volumes.  Total lung capacity 86% of predicted.  The ERV, however, was only 90% which goes along with an obesity pattern.  Normal diffusion at 82%.  Flow volume loops and time volume curves look relatively normal.   10/29/2018 CT Chest:  No acute abnormalities were identified in the CT scan of the chest.  06/09/2018  Echo:  Est EF 60%.  Mild concentric left ventricular hypertrophy.  Diastolic assessment reveals Grade I diastolic dysfunction. Normal right ventricle structure and function.  Trivial to small global pericardial effusion.      Hypothyroidism 11/22/2018    OSA (obstructive sleep apnea) 11/22/2018    Fibromyalgia 05/31/2018    Hypertension 08/02/2015    Abdominal pain 06/18/2007    Other screening mammogram 02/19/2007    Fatigue 01/19/2007    Depression 01/19/2007    Personality disorder (CMS-HCC) 01/19/2007    Chest pain 01/19/2007     04/19/2024 - Regadenoson MPI at [facility] - Global left ventricular function is normal.  LVEF is 87%.  MPI shows no evidence of myocardial infarction or ischemia.  Negative stress test.    04/19/2024 - Echocardiogram at [facility] - Technically difficult study with poor acoustic windows.  Hyperdynamic left ventricular function, EF>75%; valves were not well seen but there is not significant disease by doppler criteria.  No obvious pericardial effusion.    11/12/23 - CT Calcium Score at [facility] - Total Calcium Score 10.7; Left anterior descending artery 10.7  02/01/21 - Echocardiogram at [facility] - No regional wall motion abnormalities are seen. Overall LV systolic function appears normal. The estimated left ventricular ejection fraction is 60%.  Left ventricular contractility appears similar when compared with the prior echocardiogram performed on 04/13/20. Normal left ventricular diastolic function. Right ventricular contractility appears normal. Normal atrial chamber dimensions. The aortic, mitral and tricuspid cardiac valves appear structurally normal. There is no evidence of significant valvular regurgitation or stenosis by doppler exam.A small pericardial effusion is noted adjacent to the basal inferior and inferior lateral left ventricular segments.  There is no evidence for chamber compression or tamponade.  The effusion appears to be fairly similar in size and location to that seen on the prior echocardiogram obtained on 04/13/2020.  06/13/20 - Treadmill Only Stress Test at [facility] - Non Ischemic Maximal Exercise Stress EKG Atypical Chest Pain Non Cardiac After Completion With No ST Depression [facility] Treadmill Score=6 Low Risk  04/13/20 - Echocardiogram at [facility] - LVEF=75% With No Regional Wall Motion Abnormality Normal LV Wall Thickness Normal Chamber Dimensions Normal Right Ventricular Chamber Size And Systolic Function Normal TAPSE=2.1cm Trace Posterior Pericardial Fluid Normal Ascending Aorta Poor Visualization  12/01/18 - Cardiac Cath at [facility] - Minimal, nonobstructive coronary artery disease. Elevated LVEDP  10/28/18 - Regadenoson MPI at [facility] - This study is normal with no evidence of significant myocardial ischemia. Left ventricular systolic function is normal. There are no high risk prognostic indicators present. The pharmacologic ECG portion of the study is negative for ischemia.       Constipation 01/19/2007       I reviewed and confirmed this patient's problem list, active medications, allergies, and past medical, social, family & tobacco histories.     Review of Systems  Review of Systems   Constitutional: Positive for malaise/fatigue.   HENT: Negative.     Eyes: Negative.    Cardiovascular:  Positive for chest pain and dyspnea on exertion.   Respiratory: Negative.     Endocrine: Negative.    Hematologic/Lymphatic: Negative.  Skin: Negative.    Musculoskeletal:  Positive for back pain.   Gastrointestinal: Negative.    Genitourinary: Negative.    Neurological:  Positive for dizziness, headaches, light-headedness and weakness.   Psychiatric/Behavioral: Negative.     Allergic/Immunologic: Negative.      14 point review of systems negative except as above.    Vitals:    06/14/24 1010   BP: (!) 164/88   BP Source: Arm, Left Upper   Pulse: 78   SpO2: 98%   O2 Device: None (Room air)   PainSc: Five   Weight: 95.2 kg (209 lb 12.8 oz)   Height: 162.6 cm (5' 4)     Body mass index is 36.01 kg/m?SABRA     Physical Exam  General Appearance: no acute distress  HEENT: EOMI, mucous membranes moist, oropharynx is clear  Neck Veins: neck veins are flat & not distended  Carotid Arteries: no bruits  Chest Inspection: chest is normal in appearance  Auscultation/Percussion: lungs clear to auscultation, no rales, rhonchi, or wheezing  Cardiac Rhythm: regular rhythm & normal rate  Cardiac Auscultation: Normal S1 & S2, no S3 or S4, no rub  Murmurs: no cardiac murmurs  Abdominal Exam: soft, non-tender, normal bowel sounds, no masses or bruits  Abdominal aorta: nonpalpable   Liver & Spleen: no organomegaly  Extremities: no lower extremity edema; palpable distal pulses  Skin: warm & intact  Neurologic Exam: oriented to time, place and person; no focal neurologic deficits       Cardiovascular Studies  02/01/21   2D + DOPPLER ECHO   Result Value Ref Range    BSA 2.02 m2    Referring Provider Kyung Goodell, MD     CV ECHO PV SUPPORT STAFF [facility]     Cardiology Ultrasound Machine Philips [id number]     LVIDD 4.6 3.8 - 5.2 cm    IVS 0.9 0.6 - 0.9 cm    PW 0.9 0.6 - 0.9 cm    LVIDS 3.7 2.2 - 3.5 cm    FS 19.57 28 - 44 %    Teichholtz 33.26 %    LA volume 57 22 - 52 mL    LV mass 138 67 - 162 g    LA size 3.5 2.7 - 3.8 cm    RWT 0.39 <=0.42    Right Ventricular Basal Diameter 2.6 2.5 - 4.1 cm    Right Ventricular Mid Diameter 2.0 1.9 - 3.5 cm    Right Ventricular Wall 6.6 0.1 - 0.5 cm    Right Atrial Area 14.8 <18 cm2    Right Heart Systolic Mmode TAPSE 1.9 >1.7 cm    Left Atrium Index 28.22 16 - 34 mL/m2    Left Ventricle Mass Index 68 43 - 95 g/m2    AV peak velocity 1.4 m/s    E/A ratio 1.03     TDI lateral e' 0.100 m/s    and a peak gradient of 8 mmHg    TV rest pulmonary artery pressure 26 mmHg    Lateral E/E' ratio 11.00     Right Heart Systolic TDI S' 0.1 m/s    MV Peak E Vel PW 1.100 m/s    MV Peak A Vel 1.070 m/s    TDI Medial e' 0.110 m/s    Medial E/E' ratio 10.00     TR PEAK VELOCITY 2.1 m/s    RV SYSTOLIC PRESSURE 18     RA PRESSURE 8  Cardiovascular Health Factors  Vitals BP Readings from Last 3 Encounters:   06/14/24 (!) 164/88   05/02/24 (!) 166/78   02/01/21 (!) 140/70     Wt Readings from Last 3 Encounters:   06/14/24 95.2 kg (209 lb 12.8 oz)   02/01/21 90.7 kg (200 lb)   06/13/20 98.7 kg (217 lb 9.6 oz)     BMI Readings from Last 3 Encounters:   06/14/24 36.01 kg/m?   02/01/21 34.33 kg/m?   06/13/20 37.35 kg/m?      Smoking Social History     Tobacco Use   Smoking Status Never   Smokeless Tobacco Never      Lipid Profile Cholesterol   Date Value Ref Range Status   10/29/2023 159  Final     HDL   Date Value Ref Range Status   10/29/2023 42  Final     LDL   Date Value Ref Range Status   10/29/2023 73  Final     Triglycerides   Date Value Ref Range Status   10/29/2023 223 (H) <150 Final      Blood Sugar Hemoglobin A1C   Date Value Ref Range Status   04/06/2024 7.0 (H) <5.7 Final     Glucose   Date Value Ref Range Status   03/31/2024 191 (H) 70 - 105 Final 01/10/2020 103  Final   11/25/2018 98  Final     Glucose, POC   Date Value Ref Range Status   12/01/2018 108 (H) 70 - 100 MG/DL Final   87/79/7994 881 (H) 70 - 110 MG/DL Final   87/79/7994 883 (H) 70 - 110 MG/DL Final        ASCVD Risk Assessment:     ASCVD 10-year risk calculated: The 10-year ASCVD risk score (Arnett DK, et al., 2019) is: 34.5%*    Values used to calculate the score:      Age: 25 years      Sex: Female      Is Non-Hispanic African American: No      Diabetic: Yes      Tobacco smoker: No      Systolic Blood Pressure: 164 mmHg      Is BP treated: Yes      HDL Cholesterol: 42 mg/dL*      Total Cholesterol: 159 mg/dL*      * - Cholesterol units were assumed for this score calculation     LDL 70-189, if ASCVD 10-y risk is >7.5%, high to moderate-intensity statin therapy is recommended  Diabetes with ASCVD 10-y risk >7.5%, high-intensity statin therapy is recommended.  Diabetes with ASCVD 10-y risk <7.5%, moderate-intensity statin therapy is recommended.      Current Medications (including today's revisions)   buPROPion XL (WELLBUTRIN XL) 150 mg tablet Take one tablet by mouth daily.    cyclobenzaprine (FLEXERIL) 10 mg tablet Take one tablet by mouth at bedtime as needed.    duloxetine  DR (CYMBALTA ) 30 mg capsule Take one capsule by mouth daily. Indications: anxiousness associated with depression, neuropathic pain    escitalopram oxalate (LEXAPRO) 20 mg tablet Take one tablet by mouth daily.    folic acid/multivit-min/lutein (CENTRUM SILVER PO) Take 1 tablet by mouth daily.    levocetirizine 5 mg tab Take 1 tablet by mouth at bedtime daily.    levothyroxine (SYNTHROID) 75 mcg tablet Take 75 mcg by mouth daily.    losartan  (COZAAR ) 25 mg tablet Take one tablet by mouth daily.    meloxicam  (MOBIC ) 7.5 mg  tablet Take one tablet by mouth daily. Indications: joint damage causing pain and loss of function    metFORMIN -XR (GLUCOPHAGE  XR) 500 mg extended release tablet Take one tablet by mouth daily. montelukast (SINGULAIR) 10 mg tablet Take 10 mg by mouth at bedtime daily.    pregabalin (LYRICA) 150 mg capsule Take 150 mg by mouth twice daily.    rosuvastatin  (CRESTOR ) 10 mg tablet Take one tablet by mouth daily.         Rosamond Boettcher MD  Cardiovascular Medicine.

## 2024-06-15 ENCOUNTER — Encounter: Admit: 2024-06-15 | Discharge: 2024-06-15 | Payer: MEDICARE

## 2024-06-15 ENCOUNTER — Ambulatory Visit: Admit: 2024-06-15 | Discharge: 2024-06-16 | Payer: MEDICARE

## 2024-06-15 DIAGNOSIS — M5416 Radiculopathy, lumbar region: Principal | ICD-10-CM

## 2024-06-15 DIAGNOSIS — M4727 Other spondylosis with radiculopathy, lumbosacral region: Secondary | ICD-10-CM

## 2024-06-15 DIAGNOSIS — Z981 Arthrodesis status: Secondary | ICD-10-CM

## 2024-06-15 DIAGNOSIS — M4722 Other spondylosis with radiculopathy, cervical region: Secondary | ICD-10-CM

## 2024-06-15 NOTE — Progress Notes
 SPINE CENTER CLINIC NOTE       SUBJECTIVE: 73 year old female presenting in follow-up from last visit on 05/02/2024.  At that time she was diagnosed with cervical spondylosis with radiculopathy with a history of a cervical spine arthrodesis.  Lumbar spondylosis and potential radiculopathy.  We sent her for physical therapy for her neck and her low back.  She is completed greater than 6 weeks and is still feeling similar types of pain.  She reports the back pain is her biggest issue right now.  She feels it in her back and radiates down her right leg, an achy feeling that can go down past her knee where she feels numbness and tingling going all the way into her foot.  She has a x-ray of her lumbar spine which shows pretty significant degenerative changes but no MRI.  She is taking medications as prescribed.  She is interested in next step of management.         Review of Systems  Current Medications[1]  Allergies[2]  Physical Exam  Vitals:    06/15/24 1447   PainSc: Six      Telehealth Patient Reported Vitals       Row Name 06/15/24 1447                Pain Score SIX             Oswestry Total Score:: (Patient-Rptd) 28  Pain Score: Six  There is no height or weight on file to calculate BMI.  Physical Exam    Gen: comfortable, NAD    HEENT: NCAT, anicteric sclera    Card: Extremities warm, well-perfused, cap refill <2sec    Pulm: no distress, not cyanotic    Abd: soft, non-distended    Skin: Skin is warm and dry.  Psychiatric: normal mood and affect. Behavior is normal.     Neuro    CNII-XII grossly normal    Mental status appropriate     MSK:    Inspection: grossly symmetric, no obvious deformity, no erythema             IMPRESSION:  1. Lumbar radiculopathy    2. Lumbosacral spondylosis with radiculopathy    3. Cervical spondylosis with radiculopathy    4. H/O cervical spinal arthrodesis          PLAN: Plan is to schedule an MRI of her lumbar spine as she has failed conservative measures including greater than 6 weeks of formalized physical therapy and medication.SABRA  She will follow-up after the MRI to discuss interventional options, can consider lumbar epidural steroid injection can also consider a cervical epidural steroid injection.              [1]   Current Outpatient Medications:     buPROPion XL (WELLBUTRIN XL) 150 mg tablet, Take one tablet by mouth daily., Disp: , Rfl:     cyclobenzaprine (FLEXERIL) 10 mg tablet, Take one tablet by mouth at bedtime as needed., Disp: , Rfl:     duloxetine  DR (CYMBALTA ) 30 mg capsule, TAKE ONE CAPSULE BY MOUTH DAILY. INDICATIONS: ANXIOUSNESS ASSOCIATED WITH DEPRESSION, NEUROPATHIC PAIN, Disp: 90 capsule, Rfl: 0    escitalopram oxalate (LEXAPRO) 20 mg tablet, Take one tablet by mouth daily., Disp: , Rfl:     folic acid/multivit-min/lutein (CENTRUM SILVER PO), Take 1 tablet by mouth daily., Disp: , Rfl:     levocetirizine 5 mg tab, Take 1 tablet by mouth at bedtime daily., Disp: , Rfl:  levothyroxine (SYNTHROID) 75 mcg tablet, Take 75 mcg by mouth daily., Disp: , Rfl:     losartan  (COZAAR ) 50 mg tablet, Take one tablet by mouth daily., Disp: 90 tablet, Rfl: 3    meloxicam  (MOBIC ) 7.5 mg tablet, Take one tablet by mouth daily. Indications: joint damage causing pain and loss of function, Disp: 30 tablet, Rfl: 0    metFORMIN -XR (GLUCOPHAGE  XR) 500 mg extended release tablet, Take one tablet by mouth daily., Disp: , Rfl:     montelukast (SINGULAIR) 10 mg tablet, Take 10 mg by mouth at bedtime daily., Disp: , Rfl:     pregabalin (LYRICA) 150 mg capsule, Take 150 mg by mouth twice daily., Disp: , Rfl:     rosuvastatin  (CRESTOR ) 20 mg tablet, Take one tablet by mouth daily., Disp: 90 tablet, Rfl: 3  [2]   Allergies  Allergen Reactions    Hydrocodone RASH    Adhesive Tape RASH    Nystatin RASH

## 2024-06-28 ENCOUNTER — Encounter: Admit: 2024-06-28 | Discharge: 2024-06-28 | Payer: MEDICARE

## 2024-06-28 DIAGNOSIS — G4733 Obstructive sleep apnea (adult) (pediatric): Secondary | ICD-10-CM

## 2024-06-28 DIAGNOSIS — R0609 Other forms of dyspnea: Principal | ICD-10-CM

## 2024-06-28 DIAGNOSIS — R5383 Other fatigue: Secondary | ICD-10-CM

## 2024-06-28 DIAGNOSIS — I10 Essential (primary) hypertension: Secondary | ICD-10-CM

## 2024-06-28 NOTE — Progress Notes
 Per Dr. Linette 8/5 OV note: Did discuss with her that her fatigue could be in part due to untreated sleep apnea. Will plan to refer to sleep clinic.

## 2024-06-30 ENCOUNTER — Encounter: Admit: 2024-06-30 | Discharge: 2024-06-30 | Payer: MEDICARE

## 2024-07-07 ENCOUNTER — Encounter: Admit: 2024-07-07 | Discharge: 2024-07-07 | Payer: MEDICARE

## 2024-07-07 DIAGNOSIS — M4722 Other spondylosis with radiculopathy, cervical region: Principal | ICD-10-CM

## 2024-07-07 MED ORDER — DULOXETINE 30 MG PO CPDR
30 mg | ORAL_CAPSULE | Freq: Every day | ORAL | 2 refills | 60.00000 days | Status: AC
Start: 2024-07-07 — End: ?

## 2024-07-08 ENCOUNTER — Encounter: Admit: 2024-07-08 | Discharge: 2024-07-08 | Payer: MEDICARE

## 2024-07-14 ENCOUNTER — Encounter: Admit: 2024-07-14 | Discharge: 2024-07-14 | Payer: MEDICARE

## 2024-07-18 ENCOUNTER — Encounter: Admit: 2024-07-18 | Discharge: 2024-07-18 | Payer: MEDICARE

## 2024-07-18 ENCOUNTER — Ambulatory Visit: Admit: 2024-07-18 | Discharge: 2024-07-19 | Payer: MEDICARE

## 2024-07-18 DIAGNOSIS — M4727 Other spondylosis with radiculopathy, lumbosacral region: Secondary | ICD-10-CM

## 2024-07-18 DIAGNOSIS — M5416 Radiculopathy, lumbar region: Principal | ICD-10-CM

## 2024-07-18 DIAGNOSIS — M48061 Spinal stenosis, lumbar region without neurogenic claudication: Secondary | ICD-10-CM
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
## 2024-07-25 ENCOUNTER — Encounter: Admit: 2024-07-25 | Discharge: 2024-07-25 | Payer: MEDICARE

## 2024-08-11 ENCOUNTER — Encounter: Admit: 2024-08-11 | Discharge: 2024-08-11 | Payer: MEDICARE

## 2024-08-12 ENCOUNTER — Encounter: Admit: 2024-08-12 | Discharge: 2024-08-12 | Payer: MEDICARE

## 2024-08-15 ENCOUNTER — Encounter: Admit: 2024-08-15 | Discharge: 2024-08-15 | Payer: MEDICARE

## 2024-08-16 ENCOUNTER — Encounter: Admit: 2024-08-16 | Discharge: 2024-08-16 | Payer: MEDICARE

## 2024-08-16 ENCOUNTER — Ambulatory Visit: Admit: 2024-08-16 | Discharge: 2024-08-17 | Payer: MEDICARE

## 2024-08-16 VITALS — BP 137/63 | HR 82 | Temp 98.10000°F | Resp 16 | Ht 64.0 in | Wt 205.0 lb

## 2024-08-16 DIAGNOSIS — G4733 Obstructive sleep apnea (adult) (pediatric): Principal | ICD-10-CM

## 2024-08-16 DIAGNOSIS — G4700 Insomnia, unspecified: Secondary | ICD-10-CM

## 2024-08-16 NOTE — Patient Instructions
 Clinic Visit Summary:       - We have placed an order for a home sleep study.      - Authorization may be required from your insurance prior to scheduling the sleep study.  This authorization usually takes 7-10 business days    Please contact our sleep lab at [phone] to schedule the study after 10 business days.     If you have questions regarding the status of your order please contact the sleep lab directly at [phone].    - After the study is completed, we will discuss the results and recommendations at a follow up visit.        Please check out these following resources for Cognitive Behavioral Therapy for Insomnia (CBT-i)    Sleepio  Shuti  Sleeprate  [employer]wl  [employer]   [employer]  The phone application titled Nurse, adult. The apps icon appears as the image below        CBT-I Treatment options:     Dr. Velinda Merritts  [facility]  [email or web address]    2.  Dr. Franky Sharps  [facility]  [email or web address]     For questions or concerns,  please contact my Nurse Rachael at [phone] or send a My Chart message.     For URGENT issues after business hours, weekends, or holidays: Call [phone] and request for the Pulmonary Fellow to be paged.  For medication refills, please ask your pharmacy to send an electronic request.  Please allow at least 3 business days for medication refills.    For equipment issues, please contact your Durable Medical Equipment (DME) company directly.  To cancel, change, or schedule a clinic appointment: Call Pulmonary Scheduling at [phone]

## 2024-08-16 NOTE — Assessment & Plan Note
 Sleep onset and sleep maintenance insomnia.  She has chronic back pain which is contributing to her difficulty staying asleep. She does see a spine specialist and is due for a steroid injection to help with this. Also has some anxiety at bedtime for which she was provided with resources for CBT-I.  Her sleep maintenance insomnia is likely due to underlying OSA, hopefully resuming CPAP after the HSAT will help improve this.  The benefits of weight loss in the setting of sleep apnea were discussed. Discussed with patient to continue weight loss as additional treatment for sleep disorder breathing as loss of 10% of body mass can lead to a reduction of AHI by 25-30%

## 2024-08-18 ENCOUNTER — Encounter: Admit: 2024-08-18 | Discharge: 2024-08-18 | Payer: MEDICARE

## 2024-08-18 ENCOUNTER — Ambulatory Visit: Admit: 2024-08-18 | Discharge: 2024-08-18 | Payer: MEDICARE

## 2024-08-18 DIAGNOSIS — M5416 Radiculopathy, lumbar region: Principal | ICD-10-CM

## 2024-08-18 MED ORDER — IOHEXOL 300 MG IODINE/ML IV SOLN
1 mL | Freq: Once | 0 refills | Status: CP
Start: 2024-08-18 — End: ?

## 2024-08-18 MED ORDER — LIDOCAINE (PF) 10 MG/ML (1 %) IJ SOLN
2 mL | Freq: Once | INTRAMUSCULAR | 0 refills | Status: CP
Start: 2024-08-18 — End: ?

## 2024-08-18 MED ORDER — DEXAMETHASONE SODIUM PHOS (PF) 10 MG/ML IJ EPIDURAL SOLN
10 mg | Freq: Once | EPIDURAL | 0 refills | Status: CP
Start: 2024-08-18 — End: ?

## 2024-08-18 NOTE — Procedures
 Attending Surgeon: Ozell JAYSON Don, MD    Anesthesia: Local      Transforaminal Lumbar/Sacral THERAPEUTIC  Procedure: transforaminal epidural    Laterality: right   on 08/18/2024 1:15 PM  Location: lumbar - L5-S1 and L4-5      Consent:   Consent obtained: verbal and written  Consent given by: patient  Risks discussed: no change or worsening in pain, weakness, nerve damage, swelling, seizure, infection, bruising, reaction to medication, bleeding, pneumo thorax and allergic reaction  Alternatives discussed: alternative treatment  Discussed with patient the purpose of the treatment/procedure, other ways of treating my condition, including no treatment/ procedure and the risks and benefits of the alternatives. Patient has decided to proceed with treatment/procedure.        Universal Protocol:  Relevant documents: relevant documents present and verified  Test results: test results available and properly labeled  Imaging studies: imaging studies available  Required items: required blood products, implants, devices, and special equipment available  Site marked: the operative site was marked  Patient identity confirmed: Patient identify confirmed verbally with patient.        Time out: Immediately prior to procedure a time out was called to verify the correct patient, procedure, equipment, support staff and site/side marked as required      Procedures Details:   Indications: pain   Prep: chlorhexidine  Patient position: prone  Number of Levels: 2  Approach: paramedian  Guidance: fluoroscopy  Contrast: Procedure confirmed with contrast under live fluoroscopy.  Needle and Epidural Catheter: tuohy  Needle size: 25 G  Injection procedure: Negative aspiration for blood  Patient tolerance: Patient tolerated the procedure well with no immediate complications. Pressure was applied, and hemostasis was accomplished.  Comments: Indications:Patient presents with a diagnosis of radiculopathy. The patient's history and physical exam were reviewed. The risks, benefits and alternatives to the procedure were discussed, and all questions were answered to the patient's satisfaction. The patient agreed to proceed, and written informed consent was obtained.     Procedure in Detail: IV was started? No    The patient was brought into the procedure room and placed in the prone position on the fluoroscopy table. Standard monitors were placed, and vital signs were observed throughout the procedure. The area of the lumbar spine was prepped with Chloroprep and draped in a sterile manner. ?    The right L4-L5 vertebral bodies were identified with AP fluoroscopy. An oblique view to the right was obtained to better visualize the inferior junction of the pedicle and transverse process. The 6 o'clock position of the pedicle was marked and identified. The skin and subcutaneous tissues in the area were anesthetized with 1% lidocaine. A 25-gauge, 3.5 inch needle was directed toward the targeted point under fluoroscopy until bone was contacted. The needle was then walked inferiorly until the neural foramen was entered. A lateral fluoroscopic view was then used to place the needle tip at the 10 o'clock position of the foramen.     The right L5-S1 vertebral bodies were identified with AP fluoroscopy. An oblique view to the right was obtained to better visualize the inferior junction of the pedicle and transverse process. The 6 o'clock position of the pedicle was marked and identified. The skin and subcutaneous tissues in the area were anesthetized with 1% lidocaine. A 25-gauge, 3.5 inch needle was directed toward the targeted point under fluoroscopy until bone was contacted. The needle was then walked inferiorly until the neural foramen was entered. A lateral fluoroscopic view was then  used to place the needle tip at the 10 o'clock position of the foramen.     Negative aspiration was confirmed, and 1ml of contrast was injected at each level. Appropriate neurograms were observed under live AP fluoroscopy with no noted vascular or intrathecal uptake. Then, after negative aspiration, a solution consisting of 4 mL 1% lidocaine and ?10 (steroid) mg dexamethasone with 2.5 mL of solution easily injected at each level. The needles were removed with a 1% lidocaine flush. The patient's back was cleaned and a bandage was placed over the needle insertion points.    The same procedure was performed on the opposite side? No    Disposition: The patient tolerated the procedure well, and there were no apparent complications. Vital signs remained stable througtout the procedure. The patient was taken to the recovery area where discharge instructions for the procedure were given.     Estimated Blood Loss: minimal    Specimens: none    Complications: none      This patient's clinical history, exam, AND imaging support radiculopathy AND there is a significant impact on quality of life and function AND the pain has been present for at least 4 weeks AND they have failed to improve with noninvasive conservative care.          Estimated blood loss: none or minimal  Specimens: none  Patient tolerated the procedure well with no immediate complications. Pressure was applied, and hemostasis was accomplished.  Administrations This Visit       dexamethasone PF (DECADRON) epidural injection 10 mg       Admin Date  08/18/2024 Action  Given Dose  10 mg Route  Epidural Documented By  Olinda Houston, RN              iohexoL (OMNIPAQUE-300) 300 mg/mL injection 1 mL       Admin Date  08/18/2024 Action  Given Dose  1 mL Route  SEE ADMIN INSTRUCTIONS Documented By  Dilda, Danielle, RN              lidocaine PF 1% (10 mg/mL) injection 2 mL       Admin Date  08/18/2024 Action  Given Dose  2 mL Route  Injection Documented By  Olinda Houston, RN

## 2024-08-18 NOTE — Discharge Instructions - Supplementary Instructions
 GENERAL POST PROCEDURE INSTRUCTIONS  Physician: _________________________________  Procedure Completed Today:  Joint Injection (hip, knee, shoulder)  Cervical Epidural Steroid Injection  Cervical Transforaminal Steroid Injection  Trigger Point Injection  Caudal Epidural Steroid Injection  Piriformis Injection  Pudendal Nerve Block  Other _____________________ Thoracic Epidural Steroid Injection  Lumbar Epidural Steroid Injection  Lumbar Transforaminal Steroid Injection  Facet Joint Injection  Celiac Nerve Block  Sacrococcygeal  Sacroiliac Joint Injection   Important information following your procedure today:  You may drive today     If you had sedation, you may NOT drive today  Rest at home for the next 6 hours.  You may then begin to resume your normal activities.  DO NOT drive any vehicle, operate any power tools, drink alcohol, make any major decisions, or sign any legal documents for the next 12 hours.  Pain relief may not be immediate. It is possible you may even experience an increase in pain during the first 24-48 hours followed by a gradual decrease of your pain.  Though the procedure is generally safe, and complications are rare, we do ask that you be aware of any of the following:  Any swelling, persistent redness, new bleeding or drainage from the site of the injection.  You should not experience a severe headache.  You should not run a fever over 101oF.  New onset of sharp, severe back and or neck pain.  New onset of upper or lower extremity numbness or weakness.  New difficulty controlling bowel or bladder function after injection.  New shortness of breath.  ** If any of these occur, please call to report this occurrence to the nurse of Dr. Noralyn Pick at [phone]. If you are calling after 4:00 p.m. or on weekends or holidays, please call [phone] and ask to have the resident physician on call for the physician paged or go to your local emergency room.  You may experience soreness at the injection site. Ice can be applied at 20-minute intervals for the first 24 hours. The following day you may alternate ice with heat if you are experiencing muscle tightness, otherwise continue with ice. Ice works best at decreasing pain. Avoid application of direct heat, hot showers or hot tubs today.  Avoid strenuous activity today. You many resume your regular activities and exercise tomorrow.  Patients with diabetes may see an elevation in blood sugars for 7-10 days after the injection. It is important to pay close attention to your diet, check your blood sugars daily and report extreme elevations to the physician that manages your diabetes.  Patients taking daily blood thinners can resume their regular dose this evening.  It is important that you take all medications ordered by your pain physician. Taking medications as ordered is an important part of your pain care plan. If you cannot continue the medication plan, please notify the physician.    Possible side effects to steroids that may occur:  Flushing or redness of the face  Irritability  Fluid retention  Change in women's menses  Minor headache    If you are unable to keep your upcoming appointment, please notify the [facility] scheduler at [phone] at least 24 hours in advance. If you have questions for the surgery center, call [facility] at [phone].
